# Patient Record
Sex: Male | Born: 1980 | Race: Black or African American | Hispanic: No | Marital: Married | Smoking: Current some day smoker
Health system: Southern US, Community
[De-identification: ages and names within clinical notes are randomized; demographics above are authoritative.]

## PROBLEM LIST (undated history)

## (undated) DIAGNOSIS — I10 Essential (primary) hypertension: Secondary | ICD-10-CM

---

## 2013-10-05 DIAGNOSIS — F411 Generalized anxiety disorder: Secondary | ICD-10-CM | POA: Insufficient documentation

## 2013-10-05 DIAGNOSIS — F4310 Post-traumatic stress disorder, unspecified: Secondary | ICD-10-CM | POA: Diagnosis present

## 2014-06-27 ENCOUNTER — Emergency Department (HOSPITAL_BASED_OUTPATIENT_CLINIC_OR_DEPARTMENT_OTHER)
Admission: EM | Admit: 2014-06-27 | Discharge: 2014-06-27 | Disposition: A | Discharge status: Home / Self Care | Payer: 59 | Attending: Emergency Medicine | Admitting: Emergency Medicine

## 2014-06-27 ENCOUNTER — Encounter (HOSPITAL_BASED_OUTPATIENT_CLINIC_OR_DEPARTMENT_OTHER): Payer: Self-pay

## 2014-06-27 ENCOUNTER — Emergency Department (HOSPITAL_BASED_OUTPATIENT_CLINIC_OR_DEPARTMENT_OTHER): Payer: 59

## 2014-06-27 DIAGNOSIS — Z720 Tobacco use: Secondary | ICD-10-CM | POA: Diagnosis not present

## 2014-06-27 DIAGNOSIS — X58XXXA Exposure to other specified factors, initial encounter: Secondary | ICD-10-CM | POA: Insufficient documentation

## 2014-06-27 DIAGNOSIS — Y9289 Other specified places as the place of occurrence of the external cause: Secondary | ICD-10-CM | POA: Insufficient documentation

## 2014-06-27 DIAGNOSIS — Y998 Other external cause status: Secondary | ICD-10-CM | POA: Insufficient documentation

## 2014-06-27 DIAGNOSIS — S6992XA Unspecified injury of left wrist, hand and finger(s), initial encounter: Secondary | ICD-10-CM | POA: Diagnosis present

## 2014-06-27 DIAGNOSIS — M79643 Pain in unspecified hand: Secondary | ICD-10-CM

## 2014-06-27 DIAGNOSIS — Y9389 Activity, other specified: Secondary | ICD-10-CM | POA: Diagnosis not present

## 2014-06-27 NOTE — ED Provider Notes (Signed)
CSN: [id number]     Arrival date & time 06/27/14  1209 History   First MD Initiated Contact with Patient 06/27/14 1219     Chief Complaint  Patient presents with  . Hand Injury     (Consider location/radiation/quality/duration/timing/severity/associated sxs/prior Treatment) HPI Comments: Pt comes in with c/o bilateral hand pain. Pt states that on the left middle finger he injured it a couple of weeks ago and now he has pain every time it touches anything. He states that he is having pain over the right middle knuckle. Denies previous injury. No  Numbness or weakness. Hasn't tried anything for the symptoms  The history is provided by the patient. No language interpreter was used.    History reviewed. No pertinent past medical history. History reviewed. No pertinent past surgical history. No family history on file. History  Substance Use Topics  . Smoking status: Current Some Day Smoker  . Smokeless tobacco: Not on file  . Alcohol Use: No    Review of Systems  All other systems reviewed and are negative.     Allergies  Shellfish allergy  Home Medications   Prior to Admission medications   Not on File   BP 129/75 mmHg  Pulse 76  Temp(Src) 98.1 F (36.7 C) (Oral)  Resp 16  Ht 5\' 9"  (1.753 m)  Wt 175 lb (79.379 kg)  BMI 25.83 kg/m2  SpO2 97% Physical Exam  Constitutional: He is oriented to person, place, and time. He appears well-developed and well-nourished.  Cardiovascular: Normal rate and regular rhythm.   Pulmonary/Chest: Effort normal and breath sounds normal.  Musculoskeletal: Normal range of motion.  No gross deformity, swelling redness or warmth noted to either hand  Neurological: He is alert and oriented to person, place, and time.  Skin: Skin is warm and dry.  Nursing note and vitals reviewed.   ED Course  Procedures (including critical care time) Labs Review Labs Reviewed - No data to display  Imaging Review Dg Hand Complete Right  06/27/2014    CLINICAL DATA:  Right hand injury moving 2 weeks ago. Initial encounter.  EXAM: RIGHT HAND - COMPLETE 3+ VIEW  COMPARISON:  None.  FINDINGS: There is no evidence of fracture or dislocation. There is no evidence of arthropathy or other focal bone abnormality. Soft tissues are unremarkable.  IMPRESSION: Negative.   Electronically Signed   By: Marnee SpringJonathon  Watts M.D.   On: 06/27/2014 13:14   Dg Finger Middle Left  06/27/2014   CLINICAL DATA:  Pain following injury 6 weeks prior  EXAM: LEFT THIRD FINGER 2+V  COMPARISON:  None.  FINDINGS: Frontal, oblique, and lateral views were obtained. There is no fracture or dislocation. Joint spaces appear intact. No erosive change.  IMPRESSION: No fracture or dislocation.  No appreciable arthropathic change.   Electronically Signed   By: Bretta BangWilliam  Woodruff III M.D.   On: 06/27/2014 13:14     EKG Interpretation None      MDM   Final diagnoses:  Pain of hand, unspecified laterality    No acute bony abnormality. Pt given follow up as needed. Can take ibuprofen for pain    Teressa LowerVrinda Henok Heacock, NP 06/27/14 1322  Blane OharaJoshua Zavitz, MD 06/29/14 [phone]

## 2014-06-27 NOTE — Discharge Instructions (Signed)

## 2014-06-27 NOTE — ED Notes (Signed)
Injured left hand 1.5 months ago at work-right hand injury moving 2 weeks ago

## 2017-07-29 ENCOUNTER — Ambulatory Visit
Admission: RE | Admit: 2017-07-29 | Discharge: 2017-07-29 | Disposition: A | Discharge status: Home / Self Care | Payer: 59 | Source: Ambulatory Visit | Attending: Family Medicine | Admitting: Family Medicine

## 2017-07-29 ENCOUNTER — Other Ambulatory Visit: Payer: Self-pay | Admitting: Family Medicine

## 2017-07-29 DIAGNOSIS — R1032 Left lower quadrant pain: Secondary | ICD-10-CM

## 2017-07-29 MED ORDER — IOPAMIDOL (ISOVUE-300) INJECTION 61%
100.0000 mL | Freq: Once | INTRAVENOUS | Status: AC | PRN
  Administered 2017-07-29: 100 mL via INTRAVENOUS

## 2018-05-27 ENCOUNTER — Ambulatory Visit (HOSPITAL_COMMUNITY)
Admission: RE | Admit: 2018-05-27 | Discharge: 2018-05-27 | Disposition: A | Discharge status: Home / Self Care | Payer: BLUE CROSS/BLUE SHIELD | Attending: Psychiatry | Admitting: Psychiatry

## 2018-05-27 DIAGNOSIS — R454 Irritability and anger: Secondary | ICD-10-CM | POA: Diagnosis not present

## 2018-05-27 DIAGNOSIS — F419 Anxiety disorder, unspecified: Secondary | ICD-10-CM | POA: Diagnosis not present

## 2018-05-27 DIAGNOSIS — G4700 Insomnia, unspecified: Secondary | ICD-10-CM | POA: Insufficient documentation

## 2018-05-27 DIAGNOSIS — R45851 Suicidal ideations: Secondary | ICD-10-CM | POA: Diagnosis not present

## 2018-05-27 DIAGNOSIS — F329 Major depressive disorder, single episode, unspecified: Secondary | ICD-10-CM | POA: Diagnosis not present
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
## 2019-02-15 IMAGING — CT CT ABD-PELV W/ CM
1 of 2 series · 13 of 32 positions shown, 18 images · IV contrast (iopamidol)
Comparison: None available.

CLINICAL DATA: Initial evaluation for acute left lower quadrant
pain, burning, cramping for 2 weeks.

EXAM:
CT ABDOMEN AND PELVIS WITH CONTRAST
TECHNIQUE: Multidetector CT imaging of the abdomen and pelvis was performed
using the standard protocol following bolus administration of
intravenous contrast.
CONTRAST:  100mL INEUL6-HDD IOPAMIDOL (INEUL6-HDD) INJECTION 61%

[Series 2: abd/pelvis w/cm · axial · 0.75mm/px · z∈[-610,-145]mm · 13 of 105 slices shown, 18 images]
[im 6/105  soft-tissue]
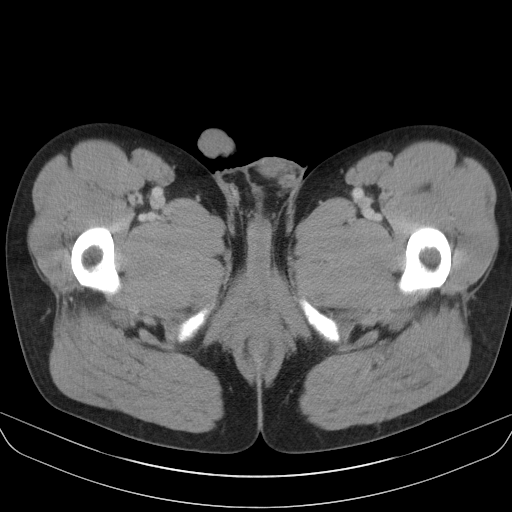
[im 6/105  bone]
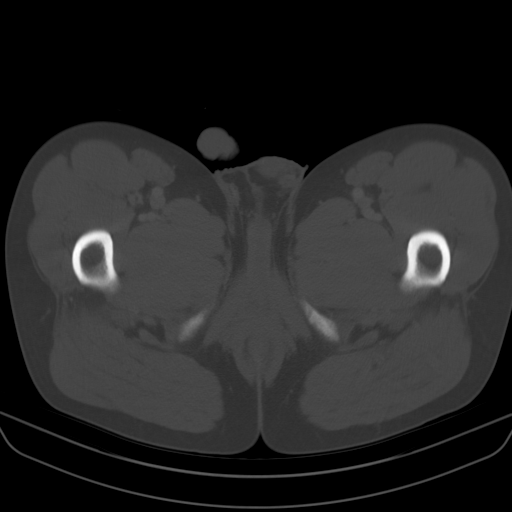
[im 17/105  soft-tissue]
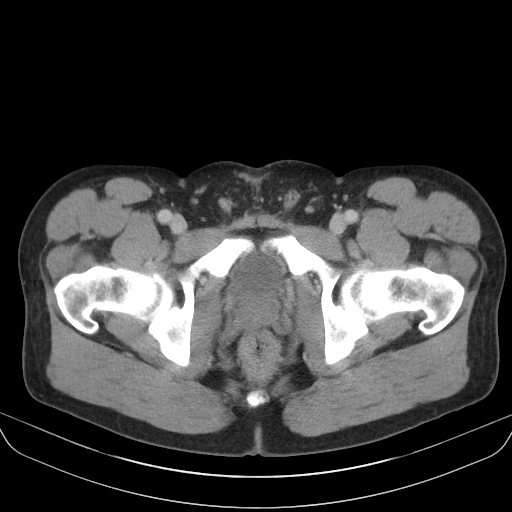
[im 22/105  soft-tissue]
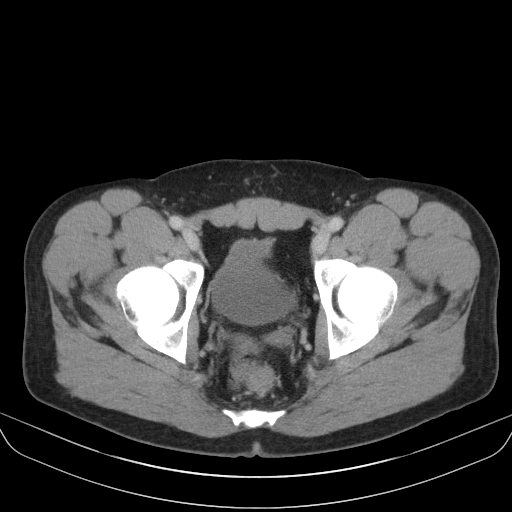
[im 33/105  soft-tissue]
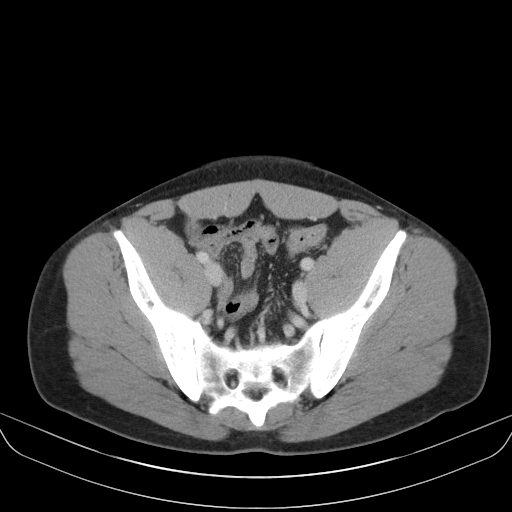
[im 39/105  soft-tissue]
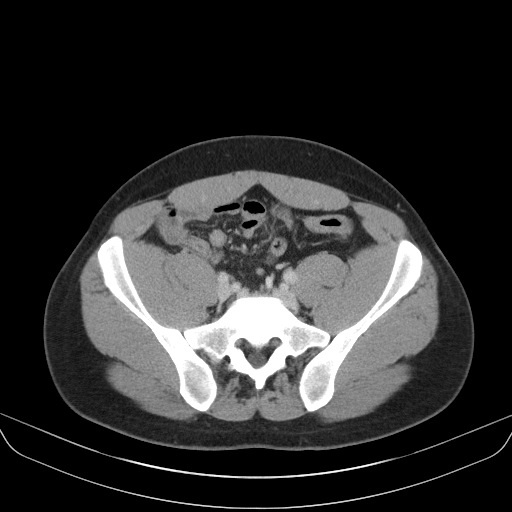
[im 50/105  soft-tissue]
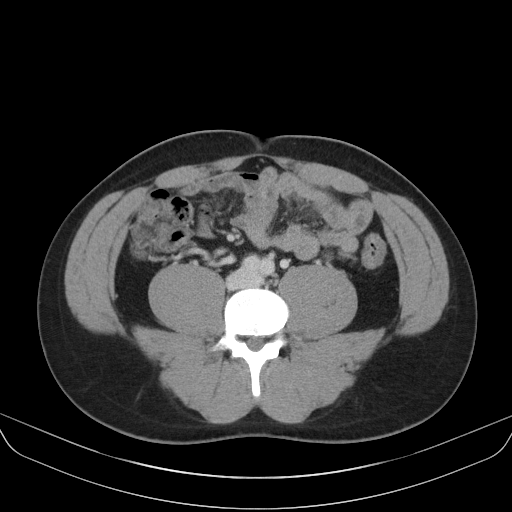
[im 55/105  soft-tissue]
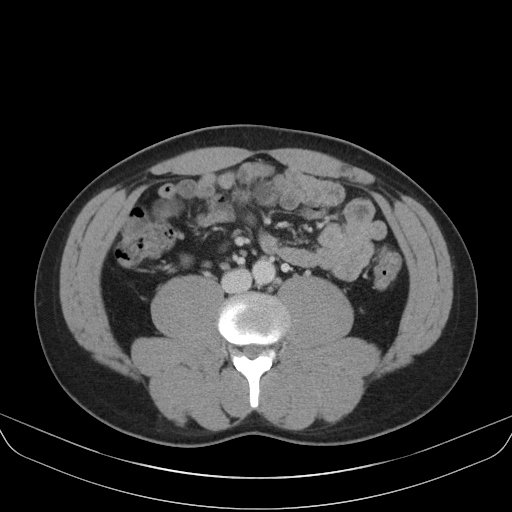
[im 66/105  soft-tissue]
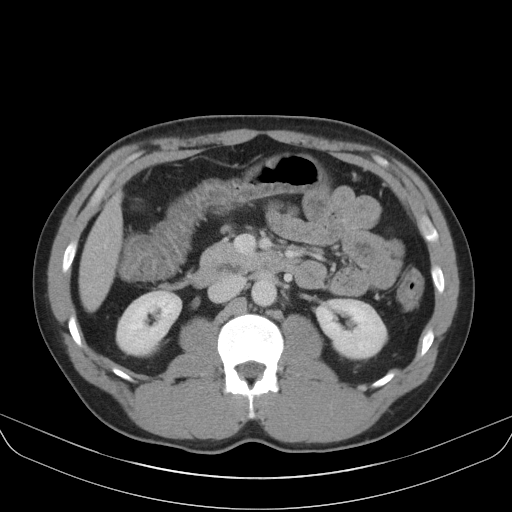
[im 72/105  soft-tissue]
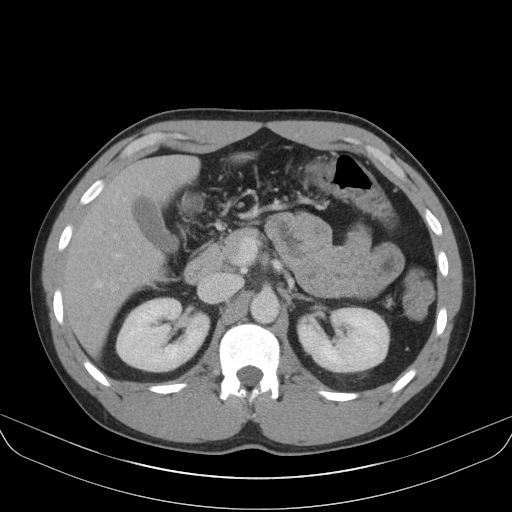
[im 72/105  bone]
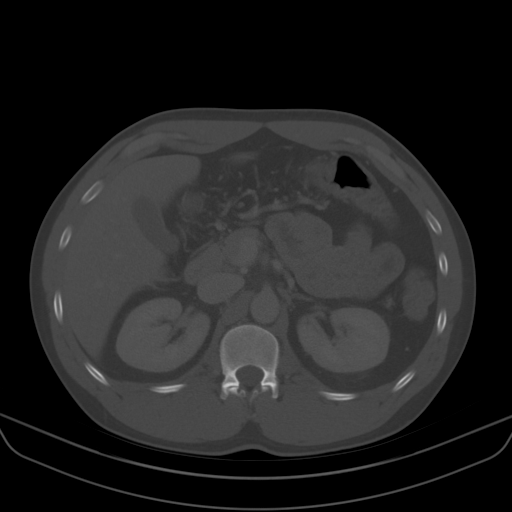
[im 83/105  soft-tissue]
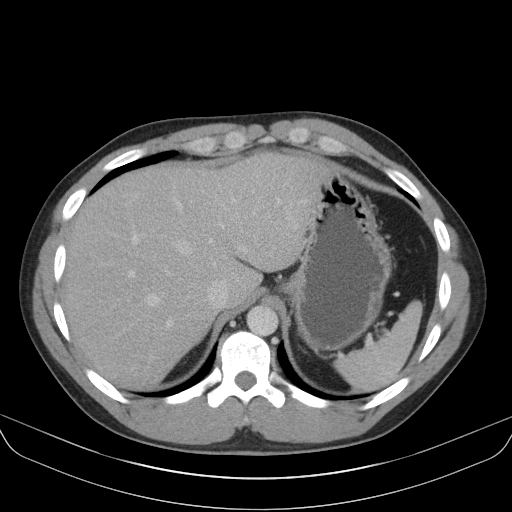
[im 83/105  lung]
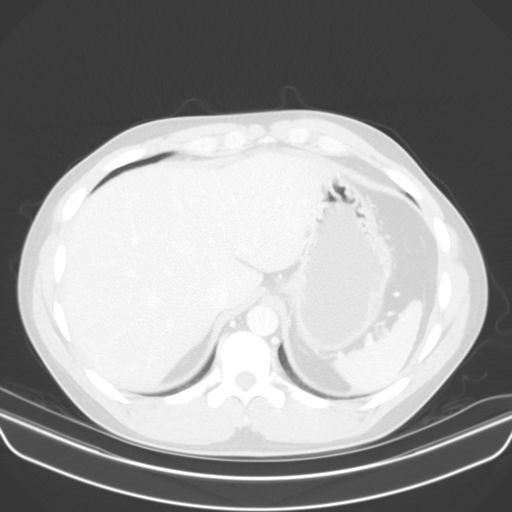
[im 88/105  soft-tissue]
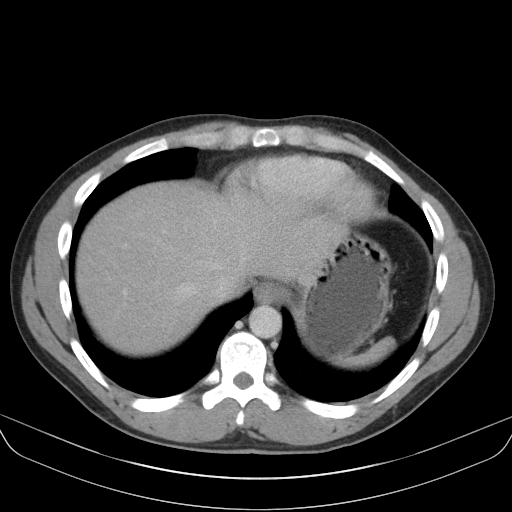
[im 88/105  lung]
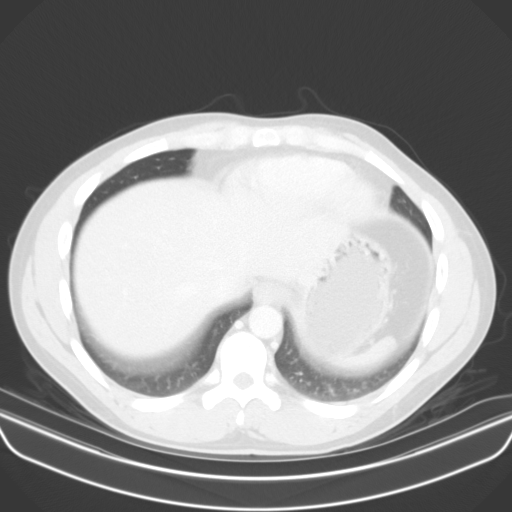
[im 94/105  lung]
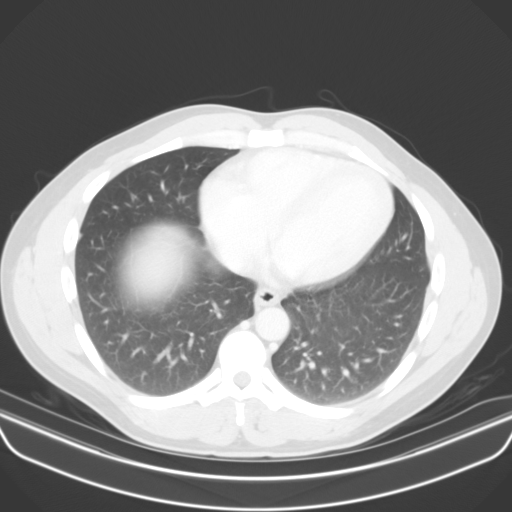
[im 99/105  soft-tissue]
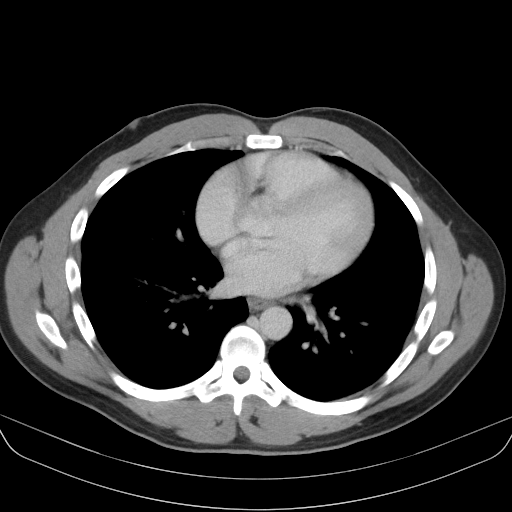
[im 99/105  lung]
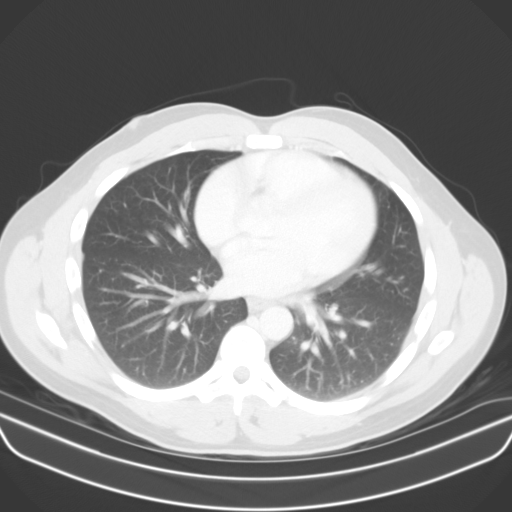

[13 of 32 positions shown; findings below may reference images not displayed]

FINDINGS: Lower chest: Visualized lung bases are clear.

Hepatobiliary: Few scattered subcentimeter hypodensities noted
within the liver, largest of which position within the right hepatic
lobe in measures 7 mm, too small the characterize, but may reflect
small cyst. These are of doubtful significance. Liver otherwise
demonstrates a normal contrast enhanced appearance. Gallbladder
within normal limits. No biliary dilatation.

Pancreas: Pancreas within normal limits.

Spleen: Spleen within normal limits.

Adrenals/Urinary Tract: Adrenal glands are normal. Kidneys equal in
size with symmetric enhancement. No nephrolithiasis, hydronephrosis,
or focal enhancing renal mass. No hydroureter. Partially distended
bladder within normal limits.

Stomach/Bowel: Stomach within normal limits. No evidence for bowel
obstruction. Appendix within normal limits

Colon is diffusely decompressed. Mild circumferential wall
thickening seen throughout the transverse, descending, and sigmoid
colon. While this finding may in part be related incomplete
distension, possible mild acute colitis could also be considered in
the correct clinical setting. No other acute inflammatory changes
seen about the bowels.

Vascular/Lymphatic: Normal intravascular enhancement seen throughout
the intra-abdominal aorta. No aneurysm. Mesenteric vessels are
patent proximally. No pathologically enlarged intra-abdominal or
pelvic lymph nodes.

Reproductive: Prostate normal.

Other: No free air or fluid.

Musculoskeletal: No acute osseus abnormality. No worrisome lytic or
blastic osseous lesions.
IMPRESSION: 1. Diffuse circumferential wall thickening about the transverse,
descending, and sigmoid colon. While this finding may in part be
related incomplete distension, possible acute colitis could also
have this appearance in the correct clinical setting.
2. No other acute intra-abdominal or pelvic process.

## 2019-07-12 ENCOUNTER — Telehealth: Payer: Self-pay | Admitting: General Practice

## 2019-07-12 NOTE — Telephone Encounter (Signed)
Called Pt and was unable to LVM: Received online request to schedule as new patient.

## 2020-05-09 ENCOUNTER — Ambulatory Visit: Payer: BC Managed Care – PPO | Attending: Internal Medicine

## 2020-05-09 DIAGNOSIS — Z23 Encounter for immunization: Secondary | ICD-10-CM

## 2020-05-09 NOTE — Progress Notes (Signed)
   Covid-19 Vaccination Clinic  Name:  Justin Benton    MRN: [id number] DOB: 14-Mar-1981  05/09/2020  [name] was observed post Covid-19 immunization for 15 minutes without incident. He was provided with Vaccine Information Sheet and instruction to access the V-Safe system.   [name] was instructed to call 911 with any severe reactions post vaccine: Marland Kitchen Difficulty breathing  . Swelling of face and throat  . A fast heartbeat  . A bad rash all over body  . Dizziness and weakness   Immunizations Administered    Name Date Dose VIS Date Route   Moderna Covid-19 Booster Vaccine 05/09/2020  1:21 PM 0.25 mL 01/26/2020 Intramuscular   Manufacturer: Gala Murdoch   Lot: [id number]   NDC: 49449-675-91

## 2022-08-22 ENCOUNTER — Emergency Department (HOSPITAL_COMMUNITY): Payer: BC Managed Care – PPO

## 2022-08-22 ENCOUNTER — Other Ambulatory Visit: Payer: Self-pay

## 2022-08-22 ENCOUNTER — Observation Stay (HOSPITAL_BASED_OUTPATIENT_CLINIC_OR_DEPARTMENT_OTHER): Payer: BC Managed Care – PPO

## 2022-08-22 ENCOUNTER — Observation Stay (HOSPITAL_COMMUNITY)
Admission: EM | Admit: 2022-08-22 | Discharge: 2022-08-23 | Disposition: A | Discharge status: Home / Self Care | Payer: BC Managed Care – PPO | Attending: Family Medicine | Admitting: Family Medicine

## 2022-08-22 ENCOUNTER — Encounter (HOSPITAL_COMMUNITY): Payer: Self-pay

## 2022-08-22 DIAGNOSIS — F418 Other specified anxiety disorders: Secondary | ICD-10-CM | POA: Diagnosis present

## 2022-08-22 DIAGNOSIS — F17210 Nicotine dependence, cigarettes, uncomplicated: Secondary | ICD-10-CM | POA: Insufficient documentation

## 2022-08-22 DIAGNOSIS — R079 Chest pain, unspecified: Secondary | ICD-10-CM

## 2022-08-22 DIAGNOSIS — I10 Essential (primary) hypertension: Secondary | ICD-10-CM | POA: Diagnosis not present

## 2022-08-22 DIAGNOSIS — R197 Diarrhea, unspecified: Secondary | ICD-10-CM | POA: Insufficient documentation

## 2022-08-22 DIAGNOSIS — Z79899 Other long term (current) drug therapy: Secondary | ICD-10-CM | POA: Diagnosis not present

## 2022-08-22 DIAGNOSIS — E785 Hyperlipidemia, unspecified: Secondary | ICD-10-CM | POA: Diagnosis present

## 2022-08-22 DIAGNOSIS — R0789 Other chest pain: Secondary | ICD-10-CM | POA: Diagnosis present

## 2022-08-22 DIAGNOSIS — L639 Alopecia areata, unspecified: Secondary | ICD-10-CM | POA: Insufficient documentation

## 2022-08-22 DIAGNOSIS — R002 Palpitations: Secondary | ICD-10-CM | POA: Diagnosis not present

## 2022-08-22 DIAGNOSIS — F4310 Post-traumatic stress disorder, unspecified: Secondary | ICD-10-CM | POA: Diagnosis present

## 2022-08-22 DIAGNOSIS — R072 Precordial pain: Secondary | ICD-10-CM

## 2022-08-22 DIAGNOSIS — I160 Hypertensive urgency: Secondary | ICD-10-CM | POA: Diagnosis not present

## 2022-08-22 DIAGNOSIS — I161 Hypertensive emergency: Secondary | ICD-10-CM | POA: Diagnosis present

## 2022-08-22 HISTORY — DX: Essential (primary) hypertension: I10

## 2022-08-22 LAB — ECHOCARDIOGRAM COMPLETE
Area-P 1/2: 2.91 cm2
Calc EF: 58.1 %
Height: 69 in
S' Lateral: 3.5 cm
Single Plane A2C EF: 57 %
Single Plane A4C EF: 59.9 %
Weight: 2960 oz

## 2022-08-22 LAB — BASIC METABOLIC PANEL
Anion gap: 10 (ref 5–15)
BUN: 12 mg/dL (ref 6–20)
CO2: 23 mmol/L (ref 22–32)
Calcium: 8.8 mg/dL — ABNORMAL LOW (ref 8.9–10.3)
Chloride: 101 mmol/L (ref 98–111)
Creatinine, Ser: 0.94 mg/dL (ref 0.61–1.24)
GFR, Estimated: >60 mL/min (ref >60)
Glucose, Bld: 110 mg/dL — ABNORMAL HIGH (ref 70–99)
Potassium: 4 mmol/L (ref 3.5–5.1)
Sodium: 134 mmol/L — ABNORMAL LOW (ref 135–145)

## 2022-08-22 LAB — CBC WITH DIFFERENTIAL/PLATELET
Abs Immature Granulocytes: 0.02 10*3/uL (ref 0.00–0.07)
Basophils Absolute: 0 10*3/uL (ref 0.0–0.1)
Basophils Relative: 1 %
Eosinophils Absolute: 0.2 10*3/uL (ref 0.0–0.5)
Eosinophils Relative: 2 %
HCT: 44.1 % (ref 39.0–52.0)
Hemoglobin: 15.6 g/dL (ref 13.0–17.0)
Immature Granulocytes: 0 %
Lymphocytes Relative: 24 %
Lymphs Abs: 1.9 10*3/uL (ref 0.7–4.0)
MCH: 36.7 pg — ABNORMAL HIGH (ref 26.0–34.0)
MCHC: 35.4 g/dL (ref 30.0–36.0)
MCV: 103.8 fL — ABNORMAL HIGH (ref 80.0–100.0)
Monocytes Absolute: 0.6 10*3/uL (ref 0.1–1.0)
Monocytes Relative: 8 %
Neutro Abs: 5.4 10*3/uL (ref 1.7–7.7)
Neutrophils Relative %: 65 %
Platelets: 215 10*3/uL (ref 150–400)
RBC: 4.25 MIL/uL (ref 4.22–5.81)
RDW: 12.3 % (ref 11.5–15.5)
WBC: 8.2 10*3/uL (ref 4.0–10.5)
nRBC: 0 % (ref 0.0–0.2)

## 2022-08-22 LAB — TROPONIN I (HIGH SENSITIVITY)
Troponin I (High Sensitivity): 3 ng/L (ref <18)
Troponin I (High Sensitivity): 4 ng/L (ref <18)

## 2022-08-22 LAB — D-DIMER, QUANTITATIVE: D-Dimer, Quant: 0.3 ug/mL-FEU (ref 0.00–0.50)

## 2022-08-22 LAB — BRAIN NATRIURETIC PEPTIDE: B Natriuretic Peptide: 8.6 pg/mL (ref 0.0–100.0)

## 2022-08-22 MED ORDER — ENOXAPARIN SODIUM 40 MG/0.4ML IJ SOSY
40.0000 mg | PREFILLED_SYRINGE | INTRAMUSCULAR | Status: DC
  Administered 2022-08-22: 40 mg via SUBCUTANEOUS
  Filled 2022-08-22 (×2): qty 0.4

## 2022-08-22 MED ORDER — METOPROLOL TARTRATE 50 MG PO TABS: 50.0000 mg | ORAL_TABLET | Freq: Once | ORAL | 0 refills | Status: DC

## 2022-08-22 MED ORDER — PERMETHRIN 5 % EX CREA
TOPICAL_CREAM | CUTANEOUS | Status: DC
  Filled 2022-08-22: qty 60

## 2022-08-22 MED ORDER — SODIUM CHLORIDE (PF) 0.9 % IJ SOLN
INTRAMUSCULAR | Status: AC
  Filled 2022-08-22: qty 50

## 2022-08-22 MED ORDER — ACETAMINOPHEN 325 MG PO TABS
650.0000 mg | ORAL_TABLET | Freq: Four times a day (QID) | ORAL | Status: DC | PRN
  Administered 2022-08-22: 650 mg via ORAL
  Filled 2022-08-22: qty 2

## 2022-08-22 MED ORDER — ASPIRIN 81 MG PO CHEW
324.0000 mg | CHEWABLE_TABLET | Freq: Once | ORAL | Status: AC
  Administered 2022-08-22: 324 mg via ORAL
  Filled 2022-08-22: qty 4

## 2022-08-22 MED ORDER — ONDANSETRON HCL 4 MG/2ML IJ SOLN: 4.0000 mg | Freq: Four times a day (QID) | INTRAMUSCULAR | Status: DC | PRN

## 2022-08-22 MED ORDER — ENOXAPARIN SODIUM 40 MG/0.4ML IJ SOSY
40.0000 mg | PREFILLED_SYRINGE | INTRAMUSCULAR | Status: DC
Start: 2022-08-22 — End: 2022-08-22

## 2022-08-22 MED ORDER — ASPIRIN 81 MG PO TBEC
81.0000 mg | DELAYED_RELEASE_TABLET | Freq: Every day | ORAL | Status: DC
  Administered 2022-08-23: 81 mg via ORAL
  Filled 2022-08-22: qty 1

## 2022-08-22 MED ORDER — AMLODIPINE BESYLATE 10 MG PO TABS
5.0000 mg | ORAL_TABLET | Freq: Every day | ORAL | Status: DC
  Administered 2022-08-22 – 2022-08-23 (×2): 5 mg via ORAL
  Filled 2022-08-22 (×2): qty 1

## 2022-08-22 MED ORDER — IOHEXOL 350 MG/ML SOLN
100.0000 mL | Freq: Once | INTRAVENOUS | Status: AC | PRN
  Administered 2022-08-22: 100 mL via INTRAVENOUS

## 2022-08-22 MED ORDER — PERMETHRIN 1 % EX LIQD
Freq: Once | CUTANEOUS | Status: AC
  Filled 2022-08-22: qty 59

## 2022-08-22 NOTE — Consult Note (Addendum)
Cardiology Consultation   Patient ID: Justin Benton MRN: [id number]; DOB: 1980/10/02  Admit date: 08/22/2022 Date of Consult: 08/22/2022  PCP:  [facility]   [facility] Providers Cardiologist:  New (Dr. Royann Shivers)  Patient Profile:   Justin Benton is a 42 y.o. male with a history of hypertension, hyperlipidemia, anxiety/ depression, and tobacco abuse who is being seen 08/22/2022 for the evaluation of chest pain at the request of Dr. Robb Matar.  History of Present Illness:   Justin Benton is a 42 year old male with a history of hypertension, hyperlipidemia, anxiety/ depression, and tobacco abuse but no known cardiac history.  Patient presented to the ED earlier this morning for further evaluation of chest pain. 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 He was in his usual state of health last night at work doing usual activities when he developed sudden onset of sharp pain in his right knee that radiated down to his calf. He states his leg was tight and he had trouble walking for about 35-40 minutes. Knee pain then resolved and he develop lower substernal non-radiating chest pain that he described as a squeezing sensation that last for about 50-20 minutes. Chest pain then resolved on its own and the knee pain came back. He had some associated mild shortness  of breath with the chest pain but states it is not unusual for him to have occasional shortness of breath and attributes this to his bad posture because shortness of breath will resolve when he sits up straight. He also reported some diaphoresis with this episodes but this is also not unusual for him. He reports night sweats and states he has to sleep in a very cold room. He reports some shortness of breath at night which sound like it is from sleep apnea rather than orthopnea or PND. He denies any exertion chest pain or dyspnea. No edema. He has some intermittent palpitations that he describes  as a fluttering sensation as well as more prolonged episodes when he is having anxiety/ panic attack. He reports intermittent lightheadedness/ dizziness that tend to be with position changes but none of this recently. No syncope. No recent fevers or illnesses. No cough or nasal congestion. He will frequently wake up "dry heaving" but no new GI issues. No abnormal bleeding in urine or stools. His biggest concern right now seems to actually be his depression. He states he has been struggling with significant depression for the last 1 to 1.5 years. He used to be on medication and was doing well but stopped taking this about 1 to 1.5 years and then stopped taking his BP and cholesterol medication and stopped exercising and eating well. He denies any current suicidal ideation but has had this in the past. He also mentioned  "anxiety attacks" multiple times and has had some chest tightness and heart racing during these episodes.  Upon arrival to the ED, patient hypertensive but vitals stable. EKG showed normal sinus rhythm, rate 88 bpm, with PVC as well as T wave inversions in leads III and aVF and then non-specific T wave changes in leads V3-V6. High-sensitivity troponin negative x2 (3 >> 4). BNP normal. D-dimer negative. CTA was negative for aortic dissection and also specifically mentioned no atherosclerotic calcification were noted in the coronary arteries. WBC 8.2, Hgb 15.6, Plts 215. Na 134, K 4.0, Glucose 110, BUN 12, Cr 0.94. Patient was admitted to Internal Medicine service and Cardiology was consulted  for further evaluation. Echo was ordered and showed LVEF of 55-60% with no regional wall motion abnormalities and no significant valvular disease.   At the time of this evaluation, patient is currently asymptomatic. No more chest pain since the above event.    He has a 15 year smoking history and currently smokes 1 pack per day. He also reports drinking 2-3 glasses of wine on the days that he works (he works  15 days out of the day). He denies any recreational drug use. He denies any known family history of heart disease including MI and CHF.  Past Medical History:  Diagnosis Date   Hypertension     History reviewed. No pertinent surgical history.   Home Medications:  Prior to Admission medications   Not on File    Inpatient Medications: Scheduled Meds:  enoxaparin (LOVENOX) injection  40 mg Subcutaneous Q24H   Continuous Infusions:  PRN Meds: acetaminophen, ondansetron (ZOFRAN) IV  Allergies:    Allergies  Allergen Reactions   Shellfish Allergy Shortness Of Breath and Swelling    Social History:   Social History   Socioeconomic History   Marital status: Married    Spouse name: Not on file   Number of children: Not on file   Years of education: Not on file   Highest education level: Not on file  Occupational History   Not on file  Tobacco Use   Smoking status: Some Days   Smokeless tobacco: Not on file  Substance and Sexual Activity   Alcohol use: No   Drug use: No   Sexual activity: Not on file  Other Topics Concern   Not on file  Social History Narrative   Not on file   Social Determinants of Health   Financial Resource Strain: Not on file  Food Insecurity: No Food Insecurity (08/22/2022)   Hunger Vital Sign    Worried About Running Out of Food in the Last Year: Never true    Ran Out of Food in the Last Year: Never true  Transportation Needs: No Transportation Needs (08/22/2022)   PRAPARE - Administrator, Civil Service (Medical): No    Lack of Transportation (Non-Medical): No  Physical Activity: Not on file  Stress: Not on file  Social Connections: Not on file  Intimate Partner Violence: Not At Risk (08/22/2022)   Humiliation, Afraid, Rape, and Kick questionnaire    Fear of Current or Ex-Partner: No    Emotionally Abused: No    Physically Abused: No    Sexually Abused: No    Family History:   History reviewed. No pertinent family  history.   ROS:  Please see the history of present illness.  Review of Systems  Constitutional:  Positive for diaphoresis. Negative for fever.  HENT:  Negative for congestion.   Respiratory:  Positive for shortness of breath. Negative for cough.   Cardiovascular:  Positive for chest pain and palpitations. Negative for leg swelling.  Gastrointestinal:  Positive for heartburn. Negative for blood in stool and melena. Abdominal pain: dry heaving. Genitourinary:  Negative for hematuria.  Musculoskeletal:  Positive for joint pain (right knee pain).  Neurological:  Negative for loss of consciousness.  Endo/Heme/Allergies:  Does not bruise/bleed easily.  Psychiatric/Behavioral:  Positive for depression and substance abuse. The patient is nervous/anxious.     All other ROS reviewed and negative.     Physical Exam/Data:   Vitals:   08/22/22 0700 08/22/22 0713 08/22/22 0853 08/22/22 0925  BP: (!) 147/97  Marland Kitchen)  141/102 (!) 164/107  Pulse: 69 64 62 62  Resp: 15 14  18   Temp:  98.2 F (36.8 C)  98.2 F (36.8 C)  TempSrc:  Oral  Oral  SpO2: 99% 98% 99% 100%  Weight:      Height:       No intake or output data in the 24 hours ending 08/22/22 1129    08/22/2022    3:12 AM 06/27/2014   12:21 PM  Last 3 Weights  Weight (lbs) 185 lb 175 lb  Weight (kg) 83.915 kg 79.379 kg     Body mass index is 27.32 kg/m.  General: 42 y.o. African-American male resting comfortably in no acute distress. HEENT: Normocephalic and atraumatic. Sclera clear.  Neck: Supple. No carotid bruits. No JVD. Heart: RRR. Distinct S1 and S2. No murmurs, gallops, or rubs. Radial and distal pedal pulses 2+ and equal bilaterally. Lungs: No increased work of breathing. Clear to ausculation bilaterally. No wheezes, rhonchi, or rales.  Abdomen: Soft, non-distended, and non-tender to palpation.   Extremities: No lower extremity edema.  Skin: Warm and dry. Neuro: Alert and oriented x3. No focal deficits. Psych: Normal affect.  Responds appropriately.   EKG:  The EKG was personally reviewed and demonstrates:  Normal sinus rhythm, rate 88 bpm, with PVC as well as T wave inversions in leads III and aVF and then non-specific T wave changes in leads V3-V6.  Telemetry:  Telemetry was personally reviewed and demonstrates:  Normal sinus rhythm with rates in the 60s to 70s.  Relevant CV Studies:  Echocardiogram 08/22/2022: Impressions: 1. Left ventricular ejection fraction, by estimation, is 55 to 60%. The  left ventricle has normal function. The left ventricle has no regional  wall motion abnormalities. Left ventricular diastolic parameters were  normal.   2. Right ventricular systolic function is normal. The right ventricular  size is normal. Tricuspid regurgitation signal is inadequate for assessing  PA pressure.   3. The mitral valve is normal in structure. No evidence of mitral valve  regurgitation. No evidence of mitral stenosis.   4. The aortic valve is normal in structure. Aortic valve regurgitation is  not visualized. No aortic stenosis is present.   5. The inferior vena cava is dilated in size with >50% respiratory  variability, suggesting right atrial pressure of 8 mmHg.    Laboratory Data:  High Sensitivity Troponin:   Recent Labs  Lab 08/22/22 0341 08/22/22 0513  TROPONINIHS 3 4     Chemistry Recent Labs  Lab 08/22/22 0341  NA 134*  K 4.0  CL 101  CO2 23  GLUCOSE 110*  BUN 12  CREATININE 0.94  CALCIUM 8.8*  GFRNONAA >60  ANIONGAP 10    No results for input(s): "PROT", "ALBUMIN", "AST", "ALT", "ALKPHOS", "BILITOT" in the last 168 hours. Lipids No results for input(s): "CHOL", "TRIG", "HDL", "LABVLDL", "LDLCALC", "CHOLHDL" in the last 168 hours.  Hematology Recent Labs  Lab 08/22/22 0341  WBC 8.2  RBC 4.25  HGB 15.6  HCT 44.1  MCV 103.8*  MCH 36.7*  MCHC 35.4  RDW 12.3  PLT 215   Thyroid No results for input(s): "TSH", "FREET4" in the last 168 hours.  BNP Recent Labs   Lab 08/22/22 0341  BNP 8.6    DDimer  Recent Labs  Lab 08/22/22 0341  DDIMER 0.30     Radiology/Studies:  ECHOCARDIOGRAM COMPLETE  Result Date: 08/22/2022    ECHOCARDIOGRAM REPORT   Patient Name:   Justin Benton Date of Exam:  08/22/2022 Medical Rec #:  [id number]      Height:       69.0 in Accession #:    [id number]     Weight:       185.0 lb Date of Birth:  November 19, 1980       BSA:          1.999 m Patient Age:    42 years       BP:           164/107 mmHg Patient Gender: M              HR:           57 bpm. Exam Location:  Inpatient Procedure: 2D Echo, Cardiac Doppler and Color Doppler Indications:    R07.9* Chest pain, unspecified  History:        Patient has no prior history of Echocardiogram examinations.                 Risk Factors:Hypertension.  Sonographer:    Mike Gip Referring Phys: [id number] DAVID MANUEL ORTIZ IMPRESSIONS  1. Left ventricular ejection fraction, by estimation, is 55 to 60%. The left ventricle has normal function. The left ventricle has no regional wall motion abnormalities. Left ventricular diastolic parameters were normal.  2. Right ventricular systolic function is normal. The right ventricular size is normal. Tricuspid regurgitation signal is inadequate for assessing PA pressure.  3. The mitral valve is normal in structure. No evidence of mitral valve regurgitation. No evidence of mitral stenosis.  4. The aortic valve is normal in structure. Aortic valve regurgitation is not visualized. No aortic stenosis is present.  5. The inferior vena cava is dilated in size with >50% respiratory variability, suggesting right atrial pressure of 8 mmHg. FINDINGS  Left Ventricle: Left ventricular ejection fraction, by estimation, is 55 to 60%. The left ventricle has normal function. The left ventricle has no regional wall motion abnormalities. The left ventricular internal cavity size was normal in size. There is  no left ventricular hypertrophy. Left ventricular diastolic parameters  were normal. Normal left ventricular filling pressure. Right Ventricle: The right ventricular size is normal. No increase in right ventricular wall thickness. Right ventricular systolic function is normal. Tricuspid regurgitation signal is inadequate for assessing PA pressure. Left Atrium: Left atrial size was normal in size. Right Atrium: Right atrial size was normal in size. Pericardium: There is no evidence of pericardial effusion. Mitral Valve: The mitral valve is normal in structure. No evidence of mitral valve regurgitation. No evidence of mitral valve stenosis. Tricuspid Valve: The tricuspid valve is normal in structure. Tricuspid valve regurgitation is trivial. No evidence of tricuspid stenosis. Aortic Valve: The aortic valve is normal in structure. Aortic valve regurgitation is not visualized. No aortic stenosis is present. Pulmonic Valve: The pulmonic valve was normal in structure. Pulmonic valve regurgitation is trivial. No evidence of pulmonic stenosis. Aorta: The aortic root is normal in size and structure. Venous: The inferior vena cava is dilated in size with greater than 50% respiratory variability, suggesting right atrial pressure of 8 mmHg. IAS/Shunts: No atrial level shunt detected by color flow Doppler.  LEFT VENTRICLE PLAX 2D LVIDd:         5.20 cm      Diastology LVIDs:         3.50 cm      LV e' medial:    10.80 cm/s LV PW:         0.90 cm  LV E/e' medial:  7.0 LV IVS:        1.00 cm      LV e' lateral:   17.20 cm/s LVOT diam:     2.10 cm      LV E/e' lateral: 4.4 LV SV:         74 LV SV Index:   37 LVOT Area:     3.46 cm  LV Volumes (MOD) LV vol d, MOD A2C: 121.0 ml LV vol d, MOD A4C: 124.0 ml LV vol s, MOD A2C: 52.0 ml LV vol s, MOD A4C: 49.7 ml LV SV MOD A2C:     69.0 ml LV SV MOD A4C:     124.0 ml LV SV MOD BP:      71.6 ml RIGHT VENTRICLE             IVC RV Basal diam:  3.80 cm     IVC diam: 2.40 cm RV S prime:     12.20 cm/s TAPSE (M-mode): 1.7 cm LEFT ATRIUM             Index         RIGHT ATRIUM           Index LA diam:        4.00 cm 2.00 cm/m   RA Area:     12.70 cm LA Vol (A2C):   61.5 ml 30.79 ml/m  RA Volume:   23.90 ml  11.96 ml/m LA Vol (A4C):   43.2 ml 21.61 ml/m LA Biplane Vol: 49.9 ml 24.97 ml/m  AORTIC VALVE             PULMONIC VALVE LVOT Vmax:   103.00 cm/s PR End Diast Vel: 4.00 msec LVOT Vmean:  74.100 cm/s LVOT VTI:    0.213 m  AORTA Ao Root diam: 3.20 cm Ao Asc diam:  3.20 cm MITRAL VALVE MV Area (PHT): 2.91 cm    SHUNTS MV Decel Time: 261 msec    Systemic VTI:  0.21 m MV E velocity: 75.40 cm/s  Systemic Diam: 2.10 cm MV A velocity: 51.00 cm/s MV E/A ratio:  1.48 Armanda Magic MD Electronically signed by Armanda Magic MD Signature Date/Time: 08/22/2022/10:53:33 AM    Final    CT Angio Chest/Abd/Pel for Dissection W and/or Wo Contrast  Result Date: 08/22/2022 CLINICAL DATA:  42 year old male presenting with lightheadedness and chest pain. EXAM: CT ANGIOGRAPHY CHEST, ABDOMEN AND PELVIS TECHNIQUE: Non-contrast CT of the chest was initially obtained. Multidetector CT imaging through the chest, abdomen and pelvis was performed using the standard protocol during bolus administration of intravenous contrast. Multiplanar reconstructed images and MIPs were obtained and reviewed to evaluate the vascular anatomy. RADIATION DOSE REDUCTION: This exam was performed according to the departmental dose-optimization program which includes automated exposure control, adjustment of the mA and/or kV according to patient size and/or use of iterative reconstruction technique. CONTRAST:  OMNIPAQUE IOHEXOL 350 MG/ML SOLN COMPARISON:  No prior chest CT. CT of the abdomen and pelvis 07/29/2017. FINDINGS: CTA CHEST FINDINGS Cardiovascular: Precontrast images demonstrate no crescentic high attenuation associated with the wall of the thoracic aorta to suggest the presence of acute intramural hemorrhage. Postcontrast images demonstrate no evidence of thoracic aortic aneurysm or dissection.  Ascending thoracic aorta, mid arch and descending thoracic aorta measure 3.8 cm, 3.0 cm and 2.7 cm in diameter respectively. No atherosclerotic calcifications are noted in the thoracic aorta or the coronary arteries. Heart size is normal. There is no significant pericardial fluid,  thickening or pericardial calcification. Mediastinum/Nodes: No pathologically enlarged mediastinal or hilar lymph nodes. Esophagus is unremarkable in appearance. No axillary lymphadenopathy. Lungs/Pleura: No acute consolidative airspace disease. No pleural effusions. No pneumothorax. No suspicious appearing pulmonary nodules or masses are noted. Musculoskeletal: There are no aggressive appearing lytic or blastic lesions noted in the visualized portions of the skeleton. Review of the MIP images confirms the above findings. CTA ABDOMEN AND PELVIS FINDINGS VASCULAR Aorta: Normal caliber aorta without aneurysm, dissection, vasculitis or significant stenosis. Celiac: Patent without evidence of aneurysm, dissection, vasculitis or significant stenosis. SMA: Patent without evidence of aneurysm, dissection, vasculitis or significant stenosis. Renals: Single right and 3 left renal arteries are noted. All renal arteries are patent without evidence of aneurysm, dissection, vasculitis, fibromuscular dysplasia or significant stenosis. IMA: Patent without evidence of aneurysm, dissection, vasculitis or significant stenosis. Inflow: Patent without evidence of aneurysm, dissection, vasculitis or significant stenosis. Veins: No obvious venous abnormality within the limitations of this arterial phase study. Review of the MIP images confirms the above findings. NON-VASCULAR Hepatobiliary: No focal liver abnormality is seen. No gallstones, gallbladder wall thickening, or biliary dilatation. Pancreas: Unremarkable. No pancreatic ductal dilatation or surrounding inflammatory changes. Spleen: Normal in size without focal abnormality. Adrenals/Urinary Tract:  Adrenal glands are unremarkable. Kidneys are normal, without renal calculi, focal lesion, or hydronephrosis. Bladder is unremarkable. Stomach/Bowel: Stomach is within normal limits. Appendix appears normal. No evidence of bowel wall thickening, distention, or inflammatory changes. Lymphatic: No lymphadenopathy noted in the abdomen or pelvis. Reproductive: Prostate gland and seminal vesicles are unremarkable in appearance. Other: No significant volume of ascites.  No pneumoperitoneum. Musculoskeletal: There are no aggressive appearing lytic or blastic lesions noted in the visualized portions of the skeleton. Review of the MIP images confirms the above findings. IMPRESSION: 1. No acute findings are noted in the chest, abdomen or pelvis. Specifically, no evidence of acute aortic syndrome. Electronically Signed   By: Trudie Reed M.D.   On: 08/22/2022 06:14   DG Chest 2 View  Result Date: 08/22/2022 CLINICAL DATA:  Mid chest pain and right knee pain. No known injury. EXAM: CHEST - 2 VIEW; RIGHT KNEE - COMPLETE 4+ VIEW COMPARISON:  None Available. FINDINGS: PA and lateral chest: The heart size and mediastinal contours are within normal limits. Both lungs are clear. The visualized skeletal structures are unremarkable. Right knee, routine four views: There is a small suprapatellar bursal effusion. Normal bone mineralization. There is no evidence of fracture, dislocation or degenerative changes. Superficial soft tissues are unremarkable. IMPRESSION: 1. No evidence of acute chest disease. 2. Small right knee suprapatellar bursal effusion. No evidence of fractures. Electronically Signed   By: Almira Bar M.D.   On: 08/22/2022 04:15   DG Knee Complete 4 Views Right  Result Date: 08/22/2022 CLINICAL DATA:  Mid chest pain and right knee pain. No known injury. EXAM: CHEST - 2 VIEW; RIGHT KNEE - COMPLETE 4+ VIEW COMPARISON:  None Available. FINDINGS: PA and lateral chest: The heart size and mediastinal contours are  within normal limits. Both lungs are clear. The visualized skeletal structures are unremarkable. Right knee, routine four views: There is a small suprapatellar bursal effusion. Normal bone mineralization. There is no evidence of fracture, dislocation or degenerative changes. Superficial soft tissues are unremarkable. IMPRESSION: 1. No evidence of acute chest disease. 2. Small right knee suprapatellar bursal effusion. No evidence of fractures. Electronically Signed   By: Almira Bar M.D.   On: 08/22/2022 04:15     Assessment and Plan:  Chest Pain Patient presented with atypical chest pain. EKG showed normal sinus rhythm with T wave inversions in leads III and aVF and otherwise only non-specific changes. High-sensitivity troponin negative x2. CTA to rule out aortic dissection was negative and did not show any evidence of coronary calcifications. Echo shows normal LV function with no wall motion abnormalities. He is currently chest pain free. Overall symptoms atypical. He bigger complaint right now seems to be his depression. However, he does have multiple CV risk factors including untreated HTN and HLD and a long smoking history. Therefore, recommend a coronary CTA for further evaluation. Will provide one time dose of Lopressor 50mg  for patient to take 90-120 minutes prior to CTA.  Hypertension History of hypertension but patient states he has not been taking his medications in the last 1 to 1.5 years. . He does not remember what he was taking. BP elevated her. Will defer management to primary team.  Hyperlipidemia Patient reports history of hyperlipidemia but has not been taking his medications in the last 1 to 1.5 years. No lipid panel in our system. He can be discharged from a cardiac standpoint so would just recommend he follow-up with his PCP for this.  Depression Patient reports severe depression over the last 1-2 years. No current suicidal ideation but he states he has had this in the past.  He states the [place] was supposed to be taking over his Psychiatry and therapy but he has not spoken with them yet. Emphasized the importance of reaching out to this. Will defer management of this to primary team.  Disposition: Patient can be discharged from a cardiac standpoint. Will plan for outpatient coronary CTA and then arrange follow-up after that.   Risk Assessment/Risk Scores:    For questions or updates, please contact [facility] Please consult [email or web address] for contact info under    Signed, Corrin Parker, PA-C  08/22/2022 11:29 AM   I have seen and examined the patient along with Signed, Corrin Parker, PA-C .  I have reviewed the chart, notes and new data.  I agree with PA/NP's note.  Key new complaints: He is currently asymptomatic. His chest symptoms are atypical. Seems to escribe anxiety not chest discomfort. Has palpitations with gradual rise and gradual decline in rate suggestive of sinus  tachycardia. Leg pain complaints are decidedly not vascular. Key examination changes: normal exam Key new findings / data: no arrhythmia on telemetry. No abnormalities on biomarkers, ECG or echocardiogram.  PLAN: No need for inpatient cardiology workup. OK to get an OP coronary CTA, but doubt it will show CAD. He has mild HTN.  Consider carvedilol for both BP and palpitations. Do not have a lipid profile.  Plans to f/u w [facility] medical.  Thurmon Fair, MD, [facility] [phone] 08/22/2022, 1:33 PM

## 2022-08-22 NOTE — ED Provider Notes (Signed)
[place] EMERGENCY DEPARTMENT AT [facility] Provider Note   CSN: [id number] Arrival date & time: 08/22/22  0300     History  Chief Complaint  Patient presents with   Chest Pain    Justin Benton is a 42 y.o. male.  Patient comfortable with episode of chest pain which is now resolved.  States he is working at [employer] doing his usual activities which include lifting but nothing out of the ordinary for him.  He suddenly developed right knee pain without any injury or trauma.  Shortly after he had this sharp right knee pain that resolved on its own after about 20 to 30 minutes but then he developed some right-sided central chest pain.  The pain was center of his chest to the right lower ribs and was constant with radiation to his bilateral arms.  Associate with shortness of breath.  Did have some diaphoresis and nausea but no vomiting.  No cough or fever.  Chest pain has now resolved after 20 to 30 minutes with no intervention. Patient denies any cardiac history.  States he was diagnosed with hypertension several years ago but stopped his medications over a year ago due to them making him feel sleepy.  Does not know what he is supposed to be taking. Chest pain is now resolved.  No history of ACS or CAD.  He still having right knee pain but is improved compared to previous.  The history is provided by the patient and the EMS personnel.  Chest Pain Associated symptoms: shortness of breath   Associated symptoms: no abdominal pain, no dizziness, no fever, no headache, no nausea, no vomiting and no weakness        Home Medications Prior to Admission medications   Not on File      Allergies    Shellfish allergy    Review of Systems   Review of Systems  Constitutional:  Negative for activity change, appetite change and fever.  HENT:  Negative for congestion and rhinorrhea.   Respiratory:  Positive for chest tightness and shortness of breath.   Cardiovascular:   Positive for chest pain.  Gastrointestinal:  Negative for abdominal pain, nausea and vomiting.  Genitourinary:  Negative for dysuria and hematuria.  Musculoskeletal:  Positive for arthralgias and myalgias.  Skin:  Negative for rash.  Neurological:  Negative for dizziness, weakness and headaches.   all other systems are negative except as noted in the HPI and PMH.    Physical Exam Updated Vital Signs BP (!) 154/103 (BP Location: Left Arm)   Pulse 94   Temp 98.6 F (37 C) (Oral)   Resp 18   Ht 5\' 9"  (1.753 m)   Wt 83.9 kg   SpO2 100%   BMI 27.32 kg/m  Physical Exam Vitals and nursing note reviewed.  Constitutional:      General: He is not in acute distress.    Appearance: He is well-developed.  HENT:     Head: Normocephalic and atraumatic.     Mouth/Throat:     Pharynx: No oropharyngeal exudate.  Eyes:     Conjunctiva/sclera: Conjunctivae normal.     Pupils: Pupils are equal, round, and reactive to light.  Neck:     Comments: No meningismus. Cardiovascular:     Rate and Rhythm: Normal rate and regular rhythm.     Heart sounds: Normal heart sounds. No murmur heard. Pulmonary:     Effort: Pulmonary effort is normal. No respiratory distress.  Breath sounds: Normal breath sounds.  Abdominal:     Palpations: Abdomen is soft.     Tenderness: There is no abdominal tenderness. There is no guarding or rebound.  Musculoskeletal:        General: No tenderness. Normal range of motion.     Cervical back: Normal range of motion and neck supple.     Comments: R knee normal to inspection.  FROM intact. No effusion. No warmth or erythema. Intact DP and PT pulses Calf soft.   Skin:    General: Skin is warm.  Neurological:     Mental Status: He is alert and oriented to person, place, and time.     Cranial Nerves: No cranial nerve deficit.     Motor: No abnormal muscle tone.     Coordination: Coordination normal.     Comments: No ataxia on finger to nose bilaterally. No  pronator drift. 5/5 strength throughout. CN 2-12 intact.Equal grip strength. Sensation intact.   Psychiatric:        Behavior: Behavior normal.     ED Results / Procedures / Treatments   Labs (all labs ordered are listed, but only abnormal results are displayed) Labs Reviewed  CBC WITH DIFFERENTIAL/PLATELET - Abnormal; Notable for the following components:      Result Value   MCV 103.8 (*)    MCH 36.7 (*)    All other components within normal limits  BASIC METABOLIC PANEL - Abnormal; Notable for the following components:   Sodium 134 (*)    Glucose, Bld 110 (*)    Calcium 8.8 (*)    All other components within normal limits  BRAIN NATRIURETIC PEPTIDE  D-DIMER, QUANTITATIVE  TROPONIN I (HIGH SENSITIVITY)  TROPONIN I (HIGH SENSITIVITY)    EKG EKG Interpretation  Date/Time:  Thursday Aug 22 2022 03:14:23 EDT Ventricular Rate:  96 PR Interval:  155 QRS Duration: 108 QT Interval:  348 QTC Calculation: 440 R Axis:   14 Text Interpretation: Sinus tachycardia Ventricular trigeminy Borderline T abnormalities, inferior leads No significant change was found Confirmed by Glynn Octave [phone]) on 08/22/2022 3:27:01 AM  Radiology CT Angio Chest/Abd/Pel for Dissection W and/or Wo Contrast  Result Date: 08/22/2022 CLINICAL DATA:  42 year old male presenting with lightheadedness and chest pain. EXAM: CT ANGIOGRAPHY CHEST, ABDOMEN AND PELVIS TECHNIQUE: Non-contrast CT of the chest was initially obtained. Multidetector CT imaging through the chest, abdomen and pelvis was performed using the standard protocol during bolus administration of intravenous contrast. Multiplanar reconstructed images and MIPs were obtained and reviewed to evaluate the vascular anatomy. RADIATION DOSE REDUCTION: This exam was performed according to the departmental dose-optimization program which includes automated exposure control, adjustment of the mA and/or kV according to patient size and/or use of iterative  reconstruction technique. CONTRAST:  OMNIPAQUE IOHEXOL 350 MG/ML SOLN COMPARISON:  No prior chest CT. CT of the abdomen and pelvis 07/29/2017. FINDINGS: CTA CHEST FINDINGS Cardiovascular: Precontrast images demonstrate no crescentic high attenuation associated with the wall of the thoracic aorta to suggest the presence of acute intramural hemorrhage. Postcontrast images demonstrate no evidence of thoracic aortic aneurysm or dissection. Ascending thoracic aorta, mid arch and descending thoracic aorta measure 3.8 cm, 3.0 cm and 2.7 cm in diameter respectively. No atherosclerotic calcifications are noted in the thoracic aorta or the coronary arteries. Heart size is normal. There is no significant pericardial fluid, thickening or pericardial calcification. Mediastinum/Nodes: No pathologically enlarged mediastinal or hilar lymph nodes. Esophagus is unremarkable in appearance. No axillary lymphadenopathy. Lungs/Pleura: No acute  consolidative airspace disease. No pleural effusions. No pneumothorax. No suspicious appearing pulmonary nodules or masses are noted. Musculoskeletal: There are no aggressive appearing lytic or blastic lesions noted in the visualized portions of the skeleton. Review of the MIP images confirms the above findings. CTA ABDOMEN AND PELVIS FINDINGS VASCULAR Aorta: Normal caliber aorta without aneurysm, dissection, vasculitis or significant stenosis. Celiac: Patent without evidence of aneurysm, dissection, vasculitis or significant stenosis. SMA: Patent without evidence of aneurysm, dissection, vasculitis or significant stenosis. Renals: Single right and 3 left renal arteries are noted. All renal arteries are patent without evidence of aneurysm, dissection, vasculitis, fibromuscular dysplasia or significant stenosis. IMA: Patent without evidence of aneurysm, dissection, vasculitis or significant stenosis. Inflow: Patent without evidence of aneurysm, dissection, vasculitis or significant stenosis.  Veins: No obvious venous abnormality within the limitations of this arterial phase study. Review of the MIP images confirms the above findings. NON-VASCULAR Hepatobiliary: No focal liver abnormality is seen. No gallstones, gallbladder wall thickening, or biliary dilatation. Pancreas: Unremarkable. No pancreatic ductal dilatation or surrounding inflammatory changes. Spleen: Normal in size without focal abnormality. Adrenals/Urinary Tract: Adrenal glands are unremarkable. Kidneys are normal, without renal calculi, focal lesion, or hydronephrosis. Bladder is unremarkable. Stomach/Bowel: Stomach is within normal limits. Appendix appears normal. No evidence of bowel wall thickening, distention, or inflammatory changes. Lymphatic: No lymphadenopathy noted in the abdomen or pelvis. Reproductive: Prostate gland and seminal vesicles are unremarkable in appearance. Other: No significant volume of ascites.  No pneumoperitoneum. Musculoskeletal: There are no aggressive appearing lytic or blastic lesions noted in the visualized portions of the skeleton. Review of the MIP images confirms the above findings. IMPRESSION: 1. No acute findings are noted in the chest, abdomen or pelvis. Specifically, no evidence of acute aortic syndrome. Electronically Signed   By: Trudie Reed M.D.   On: 08/22/2022 06:14   DG Chest 2 View  Result Date: 08/22/2022 CLINICAL DATA:  Mid chest pain and right knee pain. No known injury. EXAM: CHEST - 2 VIEW; RIGHT KNEE - COMPLETE 4+ VIEW COMPARISON:  None Available. FINDINGS: PA and lateral chest: The heart size and mediastinal contours are within normal limits. Both lungs are clear. The visualized skeletal structures are unremarkable. Right knee, routine four views: There is a small suprapatellar bursal effusion. Normal bone mineralization. There is no evidence of fracture, dislocation or degenerative changes. Superficial soft tissues are unremarkable. IMPRESSION: 1. No evidence of acute chest  disease. 2. Small right knee suprapatellar bursal effusion. No evidence of fractures. Electronically Signed   By: Almira Bar M.D.   On: 08/22/2022 04:15   DG Knee Complete 4 Views Right  Result Date: 08/22/2022 CLINICAL DATA:  Mid chest pain and right knee pain. No known injury. EXAM: CHEST - 2 VIEW; RIGHT KNEE - COMPLETE 4+ VIEW COMPARISON:  None Available. FINDINGS: PA and lateral chest: The heart size and mediastinal contours are within normal limits. Both lungs are clear. The visualized skeletal structures are unremarkable. Right knee, routine four views: There is a small suprapatellar bursal effusion. Normal bone mineralization. There is no evidence of fracture, dislocation or degenerative changes. Superficial soft tissues are unremarkable. IMPRESSION: 1. No evidence of acute chest disease. 2. Small right knee suprapatellar bursal effusion. No evidence of fractures. Electronically Signed   By: Almira Bar M.D.   On: 08/22/2022 04:15    Procedures Procedures    Medications Ordered in ED Medications  aspirin chewable tablet 324 mg (has no administration in time range)    ED Course/ Medical  Decision Making/ A&P                             Medical Decision Making Amount and/or Complexity of Data Reviewed Independent Historian: EMS Labs: ordered. Decision-making details documented in ED Course. Radiology: ordered and independent interpretation performed. Decision-making details documented in ED Course. ECG/medicine tests: ordered and independent interpretation performed. Decision-making details documented in ED Course.  Risk OTC drugs. Prescription drug management. Decision regarding hospitalization.   Episode of chest pain with nausea, shortness of breath, diaphoresis, now resolved.  EKG shows T wave inversions inferiorly with PVCs.  No comparison. Patient did develop right knee pain without injury.  He is still sore in the knee but this is improved compared to  previous.  Chest pain has resolved and not recurred.  Aspirin given.  Troponin is negative.  Labs are reassuring.  Chest x-ray is negative for pneumothorax or infiltrate.  D-dimer is negative with low suspicion for pulmonary embolism.  Unclear source of patient's atraumatic knee pain.  X-rays negative for fracture but does show small effusion.  No evidence of septic joint.  Given patient's EKG changes as well as severe chest pain with diaphoresis and shortness of breath in the setting of exertion, we will plan observation admission given elevated heart score 4.  CTA negative for aortic dissection or pulmonary embolism.  Observation admission discussed with Dr. Robb Matar        Final Clinical Impression(s) / ED Diagnoses Final diagnoses:  None    Rx / DC Orders ED Discharge Orders     None         Drayk Humbarger, Jeannett Senior, MD 08/22/22 [phone]

## 2022-08-22 NOTE — ED Notes (Signed)
ED TO INPATIENT HANDOFF REPORT  ED Nurse Name and Phone #: Donaven Criswell, Paramedic   S Name/Age/Gender Justin Benton 42 y.o. male Room/Bed: WA23/WA23  Code Status   Code Status: Full Code  Home/SNF/Other Patient oriented to: self, place, time, and situation Is this baseline? Yes   Triage Complete: Triage complete  Chief Complaint Chest pain [R07.9]  Triage Note Patient brought in by [facility], patient reports onset of right knee pain while at work, then became lightheaded and starting having chest pain. States he felt sweaty/hot and tried going outside to cool off with no relief. Was initially hypertensive in the 200's systolic per ems but had came down to the 150's by arrival here. Reports hx of HTN but being off meds for almost a year due to it making him sleepy. States chest pain has subsided some by arrival here. EKG performed upon arrival here and noted to have  a few PVC's, then went into trigeminy, EKG repeated and given to Rancour MD.   Allergies Allergies  Allergen Reactions   Shellfish Allergy Shortness Of Breath and Swelling    Level of Care/Admitting Diagnosis ED Disposition     ED Disposition  Admit   Condition  --   Comment  Hospital Area: [facility] [[id number]]  Level of Care: Telemetry [5]  Admit to tele based on following criteria: Monitor for Ischemic changes  May place patient in observation at [facility] or [facility] if equivalent level of care is available:: No  Covid Evaluation: Asymptomatic - no recent exposure (last 10 days) testing not required  Diagnosis: Chest pain [865784]  Admitting Physician: Bobette Mo [[id number]]  Attending Physician: Bobette Mo [[id number]]          B Medical/Surgery History Past Medical History:  Diagnosis Date   Hypertension    History reviewed. No pertinent surgical history.   A IV Location/Drains/Wounds Patient Lines/Drains/Airways Status     Active Line/Drains/Airways      Name Placement date Placement time Site Days   Peripheral IV 08/22/22 20 G Right Antecubital 08/22/22  0347  Antecubital  less than 1            Intake/Output Last 24 hours No intake or output data in the 24 hours ending 08/22/22 0856  Labs/Imaging Results for orders placed or performed during the hospital encounter of 08/22/22 (from the past 48 hour(s))  CBC with Differential     Status: Abnormal   Collection Time: 08/22/22  3:41 AM  Result Value Ref Range   WBC 8.2 4.0 - 10.5 K/uL   RBC 4.25 4.22 - 5.81 MIL/uL   Hemoglobin 15.6 13.0 - 17.0 g/dL   HCT 01.0 27.2 - 53.6 %   MCV 103.8 (H) 80.0 - 100.0 fL   MCH 36.7 (H) 26.0 - 34.0 pg   MCHC 35.4 30.0 - 36.0 g/dL   RDW 64.4 03.4 - 74.2 %   Platelets 215 150 - 400 K/uL   nRBC 0.0 0.0 - 0.2 %   Neutrophils Relative % 65 %   Neutro Abs 5.4 1.7 - 7.7 K/uL   Lymphocytes Relative 24 %   Lymphs Abs 1.9 0.7 - 4.0 K/uL   Monocytes Relative 8 %   Monocytes Absolute 0.6 0.1 - 1.0 K/uL   Eosinophils Relative 2 %   Eosinophils Absolute 0.2 0.0 - 0.5 K/uL   Basophils Relative 1 %   Basophils Absolute 0.0 0.0 - 0.1 K/uL   Immature Granulocytes 0 %  Abs Immature Granulocytes 0.02 0.00 - 0.07 K/uL    Comment: Performed at [facility], [address], [place], [place] [address]  Basic metabolic panel     Status: Abnormal   Collection Time: 08/22/22  3:41 AM  Result Value Ref Range   Sodium 134 (L) 135 - 145 mmol/L   Potassium 4.0 3.5 - 5.1 mmol/L    Comment: HEMOLYSIS AT THIS LEVEL MAY AFFECT RESULT   Chloride 101 98 - 111 mmol/L   CO2 23 22 - 32 mmol/L   Glucose, Bld 110 (H) 70 - 99 mg/dL    Comment: Glucose reference range applies only to samples taken after fasting for at least 8 hours.   BUN 12 6 - 20 mg/dL   Creatinine, Ser 6.21 0.61 - 1.24 mg/dL   Calcium 8.8 (L) 8.9 - 10.3 mg/dL   GFR, Estimated >30 >86 mL/min    Comment: (NOTE) Calculated using the CKD-EPI Creatinine Equation (2021)    Anion gap  10 5 - 15    Comment: Performed at [facility], [address], [place], [place] [address]  Troponin I (High Sensitivity)     Status: None   Collection Time: 08/22/22  3:41 AM  Result Value Ref Range   Troponin I (High Sensitivity) 3 <18 ng/L    Comment: (NOTE) Elevated high sensitivity troponin I (hsTnI) values and significant  changes across serial measurements may suggest ACS but many other  chronic and acute conditions are known to elevate hsTnI results.  Refer to the "Links" section for chest pain algorithms and additional  guidance. Performed at [facility], [address], [place], [place] [address]   Brain natriuretic peptide     Status: None   Collection Time: 08/22/22  3:41 AM  Result Value Ref Range   B Natriuretic Peptide 8.6 0.0 - 100.0 pg/mL    Comment: Performed at [facility], [address] [place], [place] [address]  D-dimer, quantitative     Status: None   Collection Time: 08/22/22  3:41 AM  Result Value Ref Range   D-Dimer, Quant 0.30 0.00 - 0.50 ug/mL-FEU    Comment: (NOTE) At the manufacturer cut-off value of 0.5 g/mL FEU, this assay has a negative predictive value of 95-100%.This assay is intended for use in conjunction with a clinical pretest probability (PTP) assessment model to exclude pulmonary embolism (PE) and deep venous thrombosis (DVT) in outpatients suspected of PE or DVT. Results should be correlated with clinical presentation. Performed at [facility], [address] [place], [place] [address]   Troponin I (High Sensitivity)     Status: None   Collection Time: 08/22/22  5:13 AM  Result Value Ref Range   Troponin I (High Sensitivity) 4 <18 ng/L    Comment: (NOTE) Elevated high sensitivity troponin I (hsTnI) values and significant  changes across serial measurements may suggest ACS but many other  chronic and acute conditions are known to elevate hsTnI results.   Refer to the "Links" section for chest pain algorithms and additional  guidance. Performed at [facility], [address], [place], [place] [address]    CT Angio Chest/Abd/Pel for Dissection W and/or Wo Contrast  Result Date: 08/22/2022 CLINICAL DATA:  42 year old male presenting with lightheadedness and chest pain. EXAM: CT ANGIOGRAPHY CHEST, ABDOMEN AND PELVIS TECHNIQUE: Non-contrast CT of the chest was initially obtained. Multidetector CT imaging through the chest, abdomen and pelvis was performed using the  standard protocol during bolus administration of intravenous contrast. Multiplanar reconstructed images and MIPs were obtained and reviewed to evaluate the vascular anatomy. RADIATION DOSE REDUCTION: This exam was performed according to the departmental dose-optimization program which includes automated exposure control, adjustment of the mA and/or kV according to patient size and/or use of iterative reconstruction technique. CONTRAST:  OMNIPAQUE IOHEXOL 350 MG/ML SOLN COMPARISON:  No prior chest CT. CT of the abdomen and pelvis 07/29/2017. FINDINGS: CTA CHEST FINDINGS Cardiovascular: Precontrast images demonstrate no crescentic high attenuation associated with the wall of the thoracic aorta to suggest the presence of acute intramural hemorrhage. Postcontrast images demonstrate no evidence of thoracic aortic aneurysm or dissection. Ascending thoracic aorta, mid arch and descending thoracic aorta measure 3.8 cm, 3.0 cm and 2.7 cm in diameter respectively. No atherosclerotic calcifications are noted in the thoracic aorta or the coronary arteries. Heart size is normal. There is no significant pericardial fluid, thickening or pericardial calcification. Mediastinum/Nodes: No pathologically enlarged mediastinal or hilar lymph nodes. Esophagus is unremarkable in appearance. No axillary lymphadenopathy. Lungs/Pleura: No acute consolidative airspace disease. No pleural effusions. No  pneumothorax. No suspicious appearing pulmonary nodules or masses are noted. Musculoskeletal: There are no aggressive appearing lytic or blastic lesions noted in the visualized portions of the skeleton. Review of the MIP images confirms the above findings. CTA ABDOMEN AND PELVIS FINDINGS VASCULAR Aorta: Normal caliber aorta without aneurysm, dissection, vasculitis or significant stenosis. Celiac: Patent without evidence of aneurysm, dissection, vasculitis or significant stenosis. SMA: Patent without evidence of aneurysm, dissection, vasculitis or significant stenosis. Renals: Single right and 3 left renal arteries are noted. All renal arteries are patent without evidence of aneurysm, dissection, vasculitis, fibromuscular dysplasia or significant stenosis. IMA: Patent without evidence of aneurysm, dissection, vasculitis or significant stenosis. Inflow: Patent without evidence of aneurysm, dissection, vasculitis or significant stenosis. Veins: No obvious venous abnormality within the limitations of this arterial phase study. Review of the MIP images confirms the above findings. NON-VASCULAR Hepatobiliary: No focal liver abnormality is seen. No gallstones, gallbladder wall thickening, or biliary dilatation. Pancreas: Unremarkable. No pancreatic ductal dilatation or surrounding inflammatory changes. Spleen: Normal in size without focal abnormality. Adrenals/Urinary Tract: Adrenal glands are unremarkable. Kidneys are normal, without renal calculi, focal lesion, or hydronephrosis. Bladder is unremarkable. Stomach/Bowel: Stomach is within normal limits. Appendix appears normal. No evidence of bowel wall thickening, distention, or inflammatory changes. Lymphatic: No lymphadenopathy noted in the abdomen or pelvis. Reproductive: Prostate gland and seminal vesicles are unremarkable in appearance. Other: No significant volume of ascites.  No pneumoperitoneum. Musculoskeletal: There are no aggressive appearing lytic or blastic  lesions noted in the visualized portions of the skeleton. Review of the MIP images confirms the above findings. IMPRESSION: 1. No acute findings are noted in the chest, abdomen or pelvis. Specifically, no evidence of acute aortic syndrome. Electronically Signed   By: Trudie Reed M.D.   On: 08/22/2022 06:14   DG Chest 2 View  Result Date: 08/22/2022 CLINICAL DATA:  Mid chest pain and right knee pain. No known injury. EXAM: CHEST - 2 VIEW; RIGHT KNEE - COMPLETE 4+ VIEW COMPARISON:  None Available. FINDINGS: PA and lateral chest: The heart size and mediastinal contours are within normal limits. Both lungs are clear. The visualized skeletal structures are unremarkable. Right knee, routine four views: There is a small suprapatellar bursal effusion. Normal bone mineralization. There is no evidence of fracture, dislocation or degenerative changes. Superficial soft tissues are unremarkable. IMPRESSION: 1. No evidence of acute chest disease. 2.  Small right knee suprapatellar bursal effusion. No evidence of fractures. Electronically Signed   By: Almira Bar M.D.   On: 08/22/2022 04:15   DG Knee Complete 4 Views Right  Result Date: 08/22/2022 CLINICAL DATA:  Mid chest pain and right knee pain. No known injury. EXAM: CHEST - 2 VIEW; RIGHT KNEE - COMPLETE 4+ VIEW COMPARISON:  None Available. FINDINGS: PA and lateral chest: The heart size and mediastinal contours are within normal limits. Both lungs are clear. The visualized skeletal structures are unremarkable. Right knee, routine four views: There is a small suprapatellar bursal effusion. Normal bone mineralization. There is no evidence of fracture, dislocation or degenerative changes. Superficial soft tissues are unremarkable. IMPRESSION: 1. No evidence of acute chest disease. 2. Small right knee suprapatellar bursal effusion. No evidence of fractures. Electronically Signed   By: Almira Bar M.D.   On: 08/22/2022 04:15    Pending Labs Unresulted Labs  (From admission, onward)     Start     Ordered   08/29/22 0500  Creatinine, serum  (enoxaparin (LOVENOX)    CrCl >/= 30 ml/min)  Weekly,   R     Comments: while on enoxaparin therapy    08/22/22 0809   08/23/22 0500  HIV Antibody (routine testing w rflx)  (HIV Antibody (Routine testing w reflex) panel)  Tomorrow morning,   R        08/22/22 0809   08/23/22 0500  Comprehensive metabolic panel  Tomorrow morning,   R        08/22/22 0809            Vitals/Pain Today's Vitals   08/22/22 0630 08/22/22 0700 08/22/22 0713 08/22/22 0853  BP: (!) 132/97 (!) 147/97  (!) 141/102  Pulse: 62 69 64 62  Resp: 19 15 14    Temp:   98.2 F (36.8 C)   TempSrc:   Oral   SpO2: 98% 99% 98% 99%  Weight:      Height:      PainSc:        Isolation Precautions No active isolations  Medications Medications  acetaminophen (TYLENOL) tablet 650 mg (has no administration in time range)  ondansetron (ZOFRAN) injection 4 mg (has no administration in time range)  enoxaparin (LOVENOX) injection 40 mg (has no administration in time range)  aspirin chewable tablet 324 mg (324 mg Oral Given 08/22/22 0337)  iohexol (OMNIPAQUE) 350 MG/ML injection 100 mL (100 mLs Intravenous Contrast Given 08/22/22 0526)    Mobility walks     Focused Assessments    R Recommendations: See Admitting Provider Note  Report given to:   Additional Notes:

## 2022-08-22 NOTE — Progress Notes (Signed)
Echocardiogram 2D Echocardiogram has been performed.  Toni Amend 08/22/2022, 10:40 AM

## 2022-08-22 NOTE — Discharge Instructions (Signed)
   Your cardiac CT will be scheduled at one of the below locations:   [facility] [address] [place], [place] [address] [phone]  OR  [facility] [address] [place], [place] [address] [phone]  OR   [facility] [address] [place], [place] [address] [phone]  If scheduled at [facility], please arrive at the [facility] and Children's Entrance (Entrance C2) of [facility] 30 minutes prior to test start time. You can use the FREE valet parking offered at entrance C (encouraged to control the heart rate for the test)  Proceed to the [facility] Radiology Department (first floor) to check-in and test prep.  All radiology patients and guests should use entrance C2 at [facility], accessed from [facility], even though the hospital's physical address listed is [address].    If scheduled at [facility] or [facility], please arrive 15 mins early for check-in and test prep.   Please follow these instructions carefully (unless otherwise directed):  Hold all erectile dysfunction medications at least 3 days (72 hrs) prior to test. (Ie viagra, cialis, sildenafil, tadalafil, etc) We will administer nitroglycerin during this exam.   On the Night Before the Test: Be sure to Drink plenty of water. Do not consume any caffeinated/decaffeinated beverages or chocolate 12 hours prior to your test. Do not take any antihistamines 12 hours prior to your test. If the patient has contrast allergy: Patient will need a prescription for Prednisone and very clear instructions (as follows): Prednisone 50 mg - take 13 hours prior to test Take another Prednisone 50 mg 7 hours prior to test Take another Prednisone 50 mg 1 hour prior to test Take Benadryl 50 mg 1 hour prior to test Patient must complete all  four doses of above prophylactic medications. Patient will need a ride after test due to Benadryl.  On the Day of the Test: Drink plenty of water until 1 hour prior to the test. Do not eat any food 1 hour prior to test. You may take your regular medications prior to the test.  Take metoprolol (Lopressor) two hours prior to test. If you take Furosemide/Hydrochlorothiazide/Spironolactone, please HOLD on the morning of the test.       After the Test: Drink plenty of water. After receiving IV contrast, you may experience a mild flushed feeling. This is normal. On occasion, you may experience a mild rash up to 24 hours after the test. This is not dangerous. If this occurs, you can take Benadryl 25 mg and increase your fluid intake. If you experience trouble breathing, this can be serious. If it is severe call 911 IMMEDIATELY. If it is mild, please call our office. If you take any of these medications: Glipizide/Metformin, Avandament, Glucavance, please do not take 48 hours after completing test unless otherwise instructed.  We will call to schedule your test 2-4 weeks out understanding that some insurance companies will need an authorization prior to the service being performed.   For non-scheduling related questions, please contact the cardiac imaging nurse navigator should you have any questions/concerns: Rockwell Alexandria, Cardiac Imaging Nurse Navigator Larey Brick, Cardiac Imaging Nurse Navigator [facility] Direct Office Dial: [phone]   For scheduling needs, including cancellations and rescheduling, please call [place], [phone].

## 2022-08-22 NOTE — Progress Notes (Signed)
  Transition of Care [facility]) Screening Note   Patient Details  Name: Justin Benton Date of Birth: 1981/01/08   Transition of Care [facility]) CM/SW Contact:    Lanier Clam, RN Phone Number: 08/22/2022, 2:17 PM    Transition of Care Department [facility]) has reviewed patient and no TOC needs have been identified at this time. We will continue to monitor patient advancement through interdisciplinary progression rounds. If new patient transition needs arise, please place a TOC consult.

## 2022-08-22 NOTE — ED Triage Notes (Signed)
Patient brought in by [facility], patient reports onset of right knee pain while at work, then became lightheaded and starting having chest pain. States he felt sweaty/hot and tried going outside to cool off with no relief. Was initially hypertensive in the 200's systolic per ems but had came down to the 150's by arrival here. Reports hx of HTN but being off meds for almost a year due to it making him sleepy. States chest pain has subsided some by arrival here. EKG performed upon arrival here and noted to have  a few PVC's, then went into trigeminy, EKG repeated and given to Rancour MD.

## 2022-08-22 NOTE — H&P (Signed)
History and Physical    Patient: Justin Benton:[id number] DOB: 08-Mar-1981 DOA: 08/22/2022 DOS: the patient was seen and examined on 08/22/2022 PCP: [facility]  Patient coming from: Home  Chief Complaint:  Chief Complaint  Patient presents with   Chest Pain   HPI: [name] is a 42 y.o. male with medical history significant of hypertension, hyperlipidemia and depression who was brought to the emergency department via EMS after he developed dizziness, chest pain and dyspnea at work.  The patient stated that he had sudden onset of right knee pain radiated to his calf while he was working.  He was feeling warm and sweaty so he went outside to try to cool off and felt no relief.  After a while, he then he developed pressure-like, precordial chest pain associated with diaphoresis, dizziness and dyspnea.   No  PND, orthopnea or pitting edema of the lower extremities.. He denied fever, chills, rhinorrhea, sore throat, wheezing or hemoptysis.   No abdominal pain, nausea, emesis, diarrhea, constipation, melena or hematochezia.  No flank pain, dysuria, frequency or hematuria.  No polyuria, polydipsia, polyphagia or blurred vision. He was taking a antihypertensive, a statin and an antidepressant, but subsequently stopped taking all his medications and has not taken them since at least a year  ED course: Initial vital signs were temperature 98.6 F, pulse 94, respiration 18, BP 154/103 mmHg and 100% on room air.  The patient received aspirin 324 mg p.o. x 1.  Lab work: CBC 0 white count of 8.2, hemoglobin 15.6 g/dL with an MCV 811.9 fL and platelets 215.  D-dimer was normal.  Troponin x 2 and BNP.  BMP with sodium 134, glucose 110 and calcium 8.8 mg deciliter.  The rest of the electrolytes and renal function were normal.  Imaging: 2 view chest radiograph no evidence of acute chest disease.  Right knee x-ray with small right knee suprapatellar bursal effusion.  But no evidence of fractures.   CTA chest:/Abdomen/pelvis with no acute findings.   Review of Systems: As mentioned in the history of present illness. All other systems reviewed and are negative. Past Medical History:  Diagnosis Date   Hypertension    History reviewed. No pertinent surgical history. Social History:  reports that he has been smoking. He does not have any smokeless tobacco history on file. He reports that he does not drink alcohol and does not use drugs.  Allergies  Allergen Reactions   Shellfish Allergy Shortness Of Breath and Swelling   Family history. Hypertension-mother  Prior to Admission medications   Not on File    Physical Exam: Vitals:   08/22/22 0500 08/22/22 0600 08/22/22 0630 08/22/22 0713  BP: (!) 138/91 (!) 136/99 (!) 132/97   Pulse: 66 69 62   Resp: 15 16 19    Temp:    98.2 F (36.8 C)  TempSrc:    Oral  SpO2: 98% 97% 98%   Weight:      Height:       Physical Exam Vitals and nursing note reviewed.  Constitutional:      Appearance: He is well-developed.  HENT:     Head: Normocephalic.     Nose: No rhinorrhea.     Mouth/Throat:     Mouth: Mucous membranes are moist.  Eyes:     Pupils: Pupils are equal, round, and reactive to light.  Neck:     Vascular: No JVD.  Cardiovascular:     Rate and Rhythm: Normal rate and regular  rhythm.  Pulmonary:     Breath sounds: No wheezing, rhonchi or rales.  Abdominal:     General: Bowel sounds are normal.     Palpations: Abdomen is soft.  Musculoskeletal:     Cervical back: Neck supple.     Right lower leg: No edema.     Left lower leg: No edema.  Skin:    General: Skin is warm and dry.  Neurological:     General: No focal deficit present.     Mental Status: He is alert and oriented to person, place, and time.  Psychiatric:        Mood and Affect: Mood normal.        Behavior: Behavior normal.    Data Reviewed:  There are no new results to review at this time.  Assessment and Plan: Principal Problem:   Chest  pain Observation/telemetry. Supplemental oxygen as needed. Continue daily aspirin. Normal troponin level. EKG as needed. Obtain echocardiogram.   Cardiology consult appreciated.  Active Problems:   Posttraumatic stress disorder   Anxiety with depression He stopped antidepressant. He then stopped all other meds due to depression. Will consult psychiatry.    Hypertensive emergency Begin amlodipine 5 mg p.o. daily. Monitor blood pressure and heart rate.    Hyperlipidemia Check fasting lipids. Will resume statin therapy after results.     Advance Care Planning:   Code Status: Full Code   Consults: Cardiology (Dr. Rachelle Hora Croitoru).  Family Communication:   Severity of Illness: The appropriate patient status for this patient is OBSERVATION. Observation status is judged to be reasonable and necessary in order to provide the required intensity of service to ensure the patient's safety. The patient's presenting symptoms, physical exam findings, and initial radiographic and laboratory data in the context of their medical condition is felt to place them at decreased risk for further clinical deterioration. Furthermore, it is anticipated that the patient will be medically stable for discharge from the hospital within 2 midnights of admission.   Author: Bobette Mo, MD 08/22/2022 7:48 AM  For on call review [email or web address].   This document was prepared using Dragon voice recognition software and may contain some unintended transcription errors.

## 2022-08-23 ENCOUNTER — Other Ambulatory Visit (HOSPITAL_COMMUNITY): Payer: Self-pay

## 2022-08-23 ENCOUNTER — Other Ambulatory Visit: Payer: Self-pay | Admitting: Student

## 2022-08-23 DIAGNOSIS — R0789 Other chest pain: Secondary | ICD-10-CM

## 2022-08-23 DIAGNOSIS — R072 Precordial pain: Secondary | ICD-10-CM

## 2022-08-23 LAB — LIPID PANEL
Cholesterol: 242 mg/dL — ABNORMAL HIGH (ref 0–200)
HDL: 42 mg/dL (ref >40)
LDL Cholesterol: 147 mg/dL — ABNORMAL HIGH (ref 0–99)
Total CHOL/HDL Ratio: 5.8 RATIO
Triglycerides: 265 mg/dL — ABNORMAL HIGH (ref <150)
VLDL: 53 mg/dL — ABNORMAL HIGH (ref 0–40)

## 2022-08-23 LAB — COMPREHENSIVE METABOLIC PANEL
ALT: 71 U/L — ABNORMAL HIGH (ref 0–44)
AST: 58 U/L — ABNORMAL HIGH (ref 15–41)
Albumin: 3.9 g/dL (ref 3.5–5.0)
Alkaline Phosphatase: 65 U/L (ref 38–126)
Anion gap: 8 (ref 5–15)
BUN: 11 mg/dL (ref 6–20)
CO2: 25 mmol/L (ref 22–32)
Calcium: 8.8 mg/dL — ABNORMAL LOW (ref 8.9–10.3)
Chloride: 100 mmol/L (ref 98–111)
Creatinine, Ser: 0.77 mg/dL (ref 0.61–1.24)
GFR, Estimated: >60 mL/min (ref >60)
Glucose, Bld: 113 mg/dL — ABNORMAL HIGH (ref 70–99)
Potassium: 3.7 mmol/L (ref 3.5–5.1)
Sodium: 133 mmol/L — ABNORMAL LOW (ref 135–145)
Total Bilirubin: 1.3 mg/dL — ABNORMAL HIGH (ref 0.3–1.2)
Total Protein: 7.4 g/dL (ref 6.5–8.1)

## 2022-08-23 LAB — HIV ANTIBODY (ROUTINE TESTING W REFLEX): HIV Screen 4th Generation wRfx: NONREACTIVE

## 2022-08-23 LAB — ETHANOL: Alcohol, Ethyl (B): <10 mg/dL (ref <10)

## 2022-08-23 MED ORDER — CARVEDILOL 3.125 MG PO TABS
3.1250 mg | ORAL_TABLET | Freq: Two times a day (BID) | ORAL | 1 refills | Status: DC
  Filled 2022-08-23: qty 60, 30d supply, fill #0

## 2022-08-23 MED ORDER — HYDROXYZINE HCL 25 MG PO TABS
25.0000 mg | ORAL_TABLET | Freq: Three times a day (TID) | ORAL | 0 refills | Status: AC | PRN
  Filled 2022-08-23: qty 30, 10d supply, fill #0

## 2022-08-23 MED ORDER — AMLODIPINE BESYLATE 5 MG PO TABS
5.0000 mg | ORAL_TABLET | Freq: Every day | ORAL | 1 refills | Status: AC
  Filled 2022-08-23: qty 30, 30d supply, fill #0

## 2022-08-23 MED ORDER — FLUOXETINE HCL 20 MG PO CAPS
20.0000 mg | ORAL_CAPSULE | Freq: Every day | ORAL | 1 refills | Status: AC
  Filled 2022-08-23: qty 30, 30d supply, fill #0

## 2022-08-23 MED ORDER — METOPROLOL TARTRATE 50 MG PO TABS: 50.0000 mg | ORAL_TABLET | Freq: Once | ORAL | 0 refills | Status: AC

## 2022-08-23 NOTE — Discharge Summary (Signed)
Physician Discharge Summary  Justin Benton ZOX:[id number] DOB: May 06, 1980 DOA: 08/22/2022  PCP: [facility]  Admit date: 08/22/2022 Discharge date: 08/23/2022  Time spent: 40 minutes  Recommendations for Outpatient Follow-up:  Follow outpatient CBC/CMP  Follow coronary CT scan Follow blood pressure outpatient, adjust regimen as able Follow A1c outpatient, consider statin outpatient Needs sleep study Encourage etoh cessation  Follow LFT's outpatient, additional workup as needed Needs psych follow up outpatient   Discharge Diagnoses:  Principal Problem:   Chest pain Active Problems:   Posttraumatic stress disorder   Hypertensive emergency   Anxiety with depression   Hyperlipidemia   Discharge Condition: stable  Diet recommendation: heart healthy  Filed Weights   08/22/22 0312  Weight: 83.9 kg    History of present illness:   Justin Benton is Justin Benton 42 y.o. male with medical history significant of hypertension, hyperlipidemia and depression who was brought to the emergency department via EMS after he developed dizziness, chest pain and dyspnea at work.  The patient stated that he had sudden onset of right knee pain radiated to his calf while he was working.  He was feeling warm and sweaty so he went outside to try to cool off and felt no relief.  After Justin Benton while, he then he developed pressure-like, precordial chest pain associated with diaphoresis, dizziness and dyspnea.   No  PND, orthopnea or pitting edema of the lower extremities.. He denied fever, chills, rhinorrhea, sore throat, wheezing or hemoptysis.   No abdominal pain, nausea, emesis, diarrhea, constipation, melena or hematochezia.  No flank pain, dysuria, frequency or hematuria.  No polyuria, polydipsia, polyphagia or blurred vision. He was taking Justin Benton antihypertensive, Justin Benton statin and an antidepressant, but subsequently stopped taking all his medications and has not taken them since at least Justin Benton year   He was admitted for  chest pain rule out.  Workup reassuring.  Cardiology arranging outpatient coronary CT.  See below for additional details    Hospital Course:  Assessment and Plan:  Chest pain Workup reassuring, possibly related to hypertension vs msk pain? Echo without WMA, CTA dissection protocol without acute findings Planning for outpatient coronary CT Appreciate cardiology assistance  Hypertension BP improved today, continue amlodipine at discharge  Posttraumatic stress disorder Anxiety with depression Will send out with prozac, hydroxyzine Denies SI Encouraged etoh cessation Encouraged outpatient psych follow up (no current indication for inpatient consult)   Hyperlipidemia The 10-year ASCVD risk score (Arnett DK, et al., 2019) is: 6.4%   Values used to calculate the score:     Age: 30 years     Sex: Male     Is Non-Hispanic African American: Yes     Diabetic: No     Tobacco smoker: Yes     Systolic Blood Pressure: 134 mmHg     Is BP treated: No     HDL Cholesterol: 42 mg/dL     Total Cholesterol: 242 mg/dL Follow Justin Benton outpatient, consider statin outpatient  Elevated LFT's Follow outpatient, maybe related to etoh use vs fatty liver  Knee pain Plain films with small right knee suprapatellar effusion   Needs sleep study     Procedures:  Echo IMPRESSIONS     1. Left ventricular ejection fraction, by estimation, is 55 to 60%. The  left ventricle has normal function. The left ventricle has no regional  wall motion abnormalities. Left ventricular diastolic parameters were  normal.   2. Right ventricular systolic function is normal. The right ventricular  size is normal.  Tricuspid regurgitation signal is inadequate for assessing  PA pressure.   3. The mitral valve is normal in structure. No evidence of mitral valve  regurgitation. No evidence of mitral stenosis.   4. The aortic valve is normal in structure. Aortic valve regurgitation is  not visualized. No aortic stenosis  is present.   5. The inferior vena cava is dilated in size with >50% respiratory  variability, suggesting right atrial pressure of 8 mmHg.    Consultations: cardiology  Discharge Exam: Vitals:   08/23/22 0602 08/23/22 0914  BP: (!) 145/100 (!) 134/94  Pulse: (!) 54 (!) 57  Resp: 13   Temp: 97.8 F (36.6 C)   SpO2: 100%    No new complaints Denies SI   General: No acute distress. Cardiovascular: Heart sounds show Justin Benton regular rate, and rhythm. Lungs: Clear to auscultation bilaterally  Abdomen: Soft, nontender, nondistended Neurological: Alert and oriented 3. Moves all extremities 4 with equal strength. Cranial nerves II through XII grossly intact. Skin: Warm and dry. No rashes or lesions. Extremities: No clubbing or cyanosis. No edema.  Discharge Instructions   Discharge Instructions     Call MD for:  difficulty breathing, headache or visual disturbances   Complete by: As directed    Call MD for:  extreme fatigue   Complete by: As directed    Call MD for:  hives   Complete by: As directed    Call MD for:  persistant dizziness or light-headedness   Complete by: As directed    Call MD for:  persistant nausea and vomiting   Complete by: As directed    Call MD for:  redness, tenderness, or signs of infection (pain, swelling, redness, odor or green/yellow discharge around incision site)   Complete by: As directed    Call MD for:  severe uncontrolled pain   Complete by: As directed    Call MD for:  temperature >100.4   Complete by: As directed    Diet - low sodium heart healthy   Complete by: As directed    Discharge instructions   Complete by: As directed    You were seen for chest pain.  This may have been related to your high blood pressure.  Your workup was reassuring so far.  Your ultrasound of the heart was reassuring.  Cardiology recommends you follow up for an outpatient coronary CT scan.  Cardiology is arranging follow up for this.  They sent [name] dose of  metoprolol for you to take prior to the procedure.    Your LDL was high.  You should follow up with your PCP regarding your A1c (diabetes test).  You can discuss whether [name] cholesterol medicine is indicated with your PCP.  I've stared you on amlodipine for your blood pressure.  We added coreg to help with your palpitations and blood pressure.  Follow your blood pressure with your PCP outpatient.   It sounds like you might have sleep apnea.  You should get an outpatient sleep study.  Treatment of sleep apnea can help with high blood pressure.  Your liver function tests are mildly elevated.  You should follow this up with your PCP outpatient.    Stopping drinking is important given your abnormal liver labs and your mood.  I think it would help with your palpitations.  Please follow up with your PCP if you need additional resources for this.  We'll start you back on prozac for your mood.  I'll add hydroxyzine for anxiety as needed.  Please follow up with your PCP regarding following up with Yesena Reaves psychiatrist outpatient.  Return for new, recurrent, or worsening symptoms.  Please ask your PCP to request records from this hospitalization so they know what was done and what the next steps will be.   Increase activity slowly   Complete by: As directed       Allergies as of 08/23/2022       Reactions   Shellfish Allergy Shortness Of Breath, Swelling        Medication List     TAKE these medications    amLODipine 5 MG tablet Commonly known as: NORVASC Take 1 tablet (5 mg total) by mouth daily. Start taking on: Aug 24, 2022   FLUoxetine 20 MG capsule Commonly known as: PROZAC Take 1 capsule (20 mg total) by mouth daily. Follow up with your PCP regarding titration of this medicine   hydrOXYzine 25 MG tablet Commonly known as: ATARAX Take 1 tablet (25 mg total) by mouth 3 (three) times daily as needed for anxiety. Don't drive if you take this.   metoprolol tartrate 50 MG  tablet Commonly known as: LOPRESSOR Take 1 tablet (50 mg total) by mouth once for 1 dose. Take 90 to 120 min prior to coronary CTA       Allergies  Allergen Reactions   Shellfish Allergy Shortness Of Breath and Swelling      The results of significant diagnostics from this hospitalization (including imaging, microbiology, ancillary and laboratory) are listed below for reference.    Significant Diagnostic Studies: ECHOCARDIOGRAM COMPLETE  Result Date: 08/22/2022    ECHOCARDIOGRAM REPORT   Patient Name:   VICKTOR ACOCELLA Date of Exam: 08/22/2022 Medical Rec #:  [id number]      Height:       69.0 in Accession #:    [id number]     Weight:       185.0 lb Date of Birth:  15-Apr-1980       BSA:          1.999 m Patient Age:    42 years       BP:           164/107 mmHg Patient Gender: M              HR:           57 bpm. Exam Location:  Inpatient Procedure: 2D Echo, Cardiac Doppler and Color Doppler Indications:    R07.9* Chest pain, unspecified  History:        Patient has no prior history of Echocardiogram examinations.                 Risk Factors:Hypertension.  Sonographer:    Mike Gip Referring Phys: [id number] DAVID MANUEL ORTIZ IMPRESSIONS  1. Left ventricular ejection fraction, by estimation, is 55 to 60%. The left ventricle has normal function. The left ventricle has no regional wall motion abnormalities. Left ventricular diastolic parameters were normal.  2. Right ventricular systolic function is normal. The right ventricular size is normal. Tricuspid regurgitation signal is inadequate for assessing PA pressure.  3. The mitral valve is normal in structure. No evidence of mitral valve regurgitation. No evidence of mitral stenosis.  4. The aortic valve is normal in structure. Aortic valve regurgitation is not visualized. No aortic stenosis is present.  5. The inferior vena cava is dilated in size with >50% respiratory variability, suggesting right atrial pressure of 8 mmHg. FINDINGS  Left Ventricle:  Left ventricular ejection  fraction, by estimation, is 55 to 60%. The left ventricle has normal function. The left ventricle has no regional wall motion abnormalities. The left ventricular internal cavity size was normal in size. There is  no left ventricular hypertrophy. Left ventricular diastolic parameters were normal. Normal left ventricular filling pressure. Right Ventricle: The right ventricular size is normal. No increase in right ventricular wall thickness. Right ventricular systolic function is normal. Tricuspid regurgitation signal is inadequate for assessing PA pressure. Left Atrium: Left atrial size was normal in size. Right Atrium: Right atrial size was normal in size. Pericardium: There is no evidence of pericardial effusion. Mitral Valve: The mitral valve is normal in structure. No evidence of mitral valve regurgitation. No evidence of mitral valve stenosis. Tricuspid Valve: The tricuspid valve is normal in structure. Tricuspid valve regurgitation is trivial. No evidence of tricuspid stenosis. Aortic Valve: The aortic valve is normal in structure. Aortic valve regurgitation is not visualized. No aortic stenosis is present. Pulmonic Valve: The pulmonic valve was normal in structure. Pulmonic valve regurgitation is trivial. No evidence of pulmonic stenosis. Aorta: The aortic root is normal in size and structure. Venous: The inferior vena cava is dilated in size with greater than 50% respiratory variability, suggesting right atrial pressure of 8 mmHg. IAS/Shunts: No atrial level shunt detected by color flow Doppler.  LEFT VENTRICLE PLAX 2D LVIDd:         5.20 cm      Diastology LVIDs:         3.50 cm      LV e' medial:    10.80 cm/s LV PW:         0.90 cm      LV E/e' medial:  7.0 LV IVS:        1.00 cm      LV e' lateral:   17.20 cm/s LVOT diam:     2.10 cm      LV E/e' lateral: 4.4 LV SV:         74 LV SV Index:   37 LVOT Area:     3.46 cm  LV Volumes (MOD) LV vol d, MOD A2C: 121.0 ml LV vol d, MOD  A4C: 124.0 ml LV vol s, MOD A2C: 52.0 ml LV vol s, MOD A4C: 49.7 ml LV SV MOD A2C:     69.0 ml LV SV MOD A4C:     124.0 ml LV SV MOD BP:      71.6 ml RIGHT VENTRICLE             IVC RV Basal diam:  3.80 cm     IVC diam: 2.40 cm RV S prime:     12.20 cm/s TAPSE (M-mode): 1.7 cm LEFT ATRIUM             Index        RIGHT ATRIUM           Index LA diam:        4.00 cm 2.00 cm/m   RA Area:     12.70 cm LA Vol (A2C):   61.5 ml 30.79 ml/m  RA Volume:   23.90 ml  11.96 ml/m LA Vol (A4C):   43.2 ml 21.61 ml/m LA Biplane Vol: 49.9 ml 24.97 ml/m  AORTIC VALVE             PULMONIC VALVE LVOT Vmax:   103.00 cm/s PR End Diast Vel: 4.00 msec LVOT Vmean:  74.100 cm/s LVOT VTI:    0.213 m  AORTA Ao Root diam: 3.20 cm Ao Asc diam:  3.20 cm MITRAL VALVE MV Area (PHT): 2.91 cm    SHUNTS MV Decel Time: 261 msec    Systemic VTI:  0.21 m MV E velocity: 75.40 cm/s  Systemic Diam: 2.10 cm MV Jazalynn Mireles velocity: 51.00 cm/s MV E/Denisse Whitenack ratio:  1.48 Armanda Magic MD Electronically signed by Armanda Magic MD Signature Date/Time: 08/22/2022/10:53:33 AM    Final    CT Angio Chest/Abd/Pel for Dissection W and/or Wo Contrast  Result Date: 08/22/2022 CLINICAL DATA:  42 year old male presenting with lightheadedness and chest pain. EXAM: CT ANGIOGRAPHY CHEST, ABDOMEN AND PELVIS TECHNIQUE: Non-contrast CT of the chest was initially obtained. Multidetector CT imaging through the chest, abdomen and pelvis was performed using the standard protocol during bolus administration of intravenous contrast. Multiplanar reconstructed images and MIPs were obtained and reviewed to evaluate the vascular anatomy. RADIATION DOSE REDUCTION: This exam was performed according to the departmental dose-optimization program which includes automated exposure control, adjustment of the mA and/or kV according to patient size and/or use of iterative reconstruction technique. CONTRAST:  OMNIPAQUE IOHEXOL 350 MG/ML SOLN COMPARISON:  No prior chest CT. CT of the abdomen and  pelvis 07/29/2017. FINDINGS: CTA CHEST FINDINGS Cardiovascular: Precontrast images demonstrate no crescentic high attenuation associated with the wall of the thoracic aorta to suggest the presence of acute intramural hemorrhage. Postcontrast images demonstrate no evidence of thoracic aortic aneurysm or dissection. Ascending thoracic aorta, mid arch and descending thoracic aorta measure 3.8 cm, 3.0 cm and 2.7 cm in diameter respectively. No atherosclerotic calcifications are noted in the thoracic aorta or the coronary arteries. Heart size is normal. There is no significant pericardial fluid, thickening or pericardial calcification. Mediastinum/Nodes: No pathologically enlarged mediastinal or hilar lymph nodes. Esophagus is unremarkable in appearance. No axillary lymphadenopathy. Lungs/Pleura: No acute consolidative airspace disease. No pleural effusions. No pneumothorax. No suspicious appearing pulmonary nodules or masses are noted. Musculoskeletal: There are no aggressive appearing lytic or blastic lesions noted in the visualized portions of the skeleton. Review of the MIP images confirms the above findings. CTA ABDOMEN AND PELVIS FINDINGS VASCULAR Aorta: Normal caliber aorta without aneurysm, dissection, vasculitis or significant stenosis. Celiac: Patent without evidence of aneurysm, dissection, vasculitis or significant stenosis. SMA: Patent without evidence of aneurysm, dissection, vasculitis or significant stenosis. Renals: Single right and 3 left renal arteries are noted. All renal arteries are patent without evidence of aneurysm, dissection, vasculitis, fibromuscular dysplasia or significant stenosis. IMA: Patent without evidence of aneurysm, dissection, vasculitis or significant stenosis. Inflow: Patent without evidence of aneurysm, dissection, vasculitis or significant stenosis. Veins: No obvious venous abnormality within the limitations of this arterial phase study. Review of the MIP images confirms the  above findings. NON-VASCULAR Hepatobiliary: No focal liver abnormality is seen. No gallstones, gallbladder wall thickening, or biliary dilatation. Pancreas: Unremarkable. No pancreatic ductal dilatation or surrounding inflammatory changes. Spleen: Normal in size without focal abnormality. Adrenals/Urinary Tract: Adrenal glands are unremarkable. Kidneys are normal, without renal calculi, focal lesion, or hydronephrosis. Bladder is unremarkable. Stomach/Bowel: Stomach is within normal limits. Appendix appears normal. No evidence of bowel wall thickening, distention, or inflammatory changes. Lymphatic: No lymphadenopathy noted in the abdomen or pelvis. Reproductive: Prostate gland and seminal vesicles are unremarkable in appearance. Other: No significant volume of ascites.  No pneumoperitoneum. Musculoskeletal: There are no aggressive appearing lytic or blastic lesions noted in the visualized portions of the skeleton. Review of the MIP images confirms the above findings. IMPRESSION: 1. No acute findings are noted  in the chest, abdomen or pelvis. Specifically, no evidence of acute aortic syndrome. Electronically Signed   By: Trudie Reed M.D.   On: 08/22/2022 06:14   DG Chest 2 View  Result Date: 08/22/2022 CLINICAL DATA:  Mid chest pain and right knee pain. No known injury. EXAM: CHEST - 2 VIEW; RIGHT KNEE - COMPLETE 4+ VIEW COMPARISON:  None Available. FINDINGS: PA and lateral chest: The heart size and mediastinal contours are within normal limits. Both lungs are clear. The visualized skeletal structures are unremarkable. Right knee, routine four views: There is Bristyl Mclees small suprapatellar bursal effusion. Normal bone mineralization. There is no evidence of fracture, dislocation or degenerative changes. Superficial soft tissues are unremarkable. IMPRESSION: 1. No evidence of acute chest disease. 2. Small right knee suprapatellar bursal effusion. No evidence of fractures. Electronically Signed   By: Almira Bar  M.D.   On: 08/22/2022 04:15   DG Knee Complete 4 Views Right  Result Date: 08/22/2022 CLINICAL DATA:  Mid chest pain and right knee pain. No known injury. EXAM: CHEST - 2 VIEW; RIGHT KNEE - COMPLETE 4+ VIEW COMPARISON:  None Available. FINDINGS: PA and lateral chest: The heart size and mediastinal contours are within normal limits. Both lungs are clear. The visualized skeletal structures are unremarkable. Right knee, routine four views: There is Juanisha Bautch small suprapatellar bursal effusion. Normal bone mineralization. There is no evidence of fracture, dislocation or degenerative changes. Superficial soft tissues are unremarkable. IMPRESSION: 1. No evidence of acute chest disease. 2. Small right knee suprapatellar bursal effusion. No evidence of fractures. Electronically Signed   By: Almira Bar M.D.   On: 08/22/2022 04:15    Microbiology: No results found for this or any previous visit (from the past 240 hour(s)).   Labs: Basic Metabolic Panel: Recent Labs  Lab 08/22/22 0341 08/23/22 0423  NA 134* 133*  K 4.0 3.7  CL 101 100  CO2 23 25  GLUCOSE 110* 113*  BUN 12 11  CREATININE 0.94 0.77  CALCIUM 8.8* 8.8*   Liver Function Tests: Recent Labs  Lab 08/23/22 0423  AST 58*  ALT 71*  ALKPHOS 65  BILITOT 1.3*  PROT 7.4  ALBUMIN 3.9   No results for input(s): "LIPASE", "AMYLASE" in the last 168 hours. No results for input(s): "AMMONIA" in the last 168 hours. CBC: Recent Labs  Lab 08/22/22 0341  WBC 8.2  NEUTROABS 5.4  HGB 15.6  HCT 44.1  MCV 103.8*  PLT 215   Cardiac Enzymes: No results for input(s): "CKTOTAL", "CKMB", "CKMBINDEX", "TROPONINI" in the last 168 hours. BNP: BNP (last 3 results) Recent Labs    08/22/22 0341  BNP 8.6    ProBNP (last 3 results) No results for input(s): "PROBNP" in the last 8760 hours.  CBG: No results for input(s): "GLUCAP" in the last 168 hours.     Signed:  Lacretia Nicks MD.  [facility] 08/23/2022, 10:55 AM

## 2022-08-23 NOTE — Progress Notes (Signed)
Ordered outpatient coronary CTA for further evaluation of chest pain. Please see consult note from 08/22/2022 for more information.  Corrin Parker, PA-C 08/23/2022 10:57 AM

## 2022-09-28 NOTE — Progress Notes (Deleted)
Cardiology Office Note:    Date:  09/28/2022   ID:  Justin Benton, DOB January 07, 1981, MRN [id number]  PCP:  [facility]  Cardiologist:  Thurmon Fair, MD  Electrophysiologist:  None   Referring MD: [facility]   Chief Complaint: hospital follow-up of chest pain  History of Present Illness:    Justin Benton is a 42 y.o. male with a history of hypertension, hyperlipidemia, anxiety/ depression, and tobacco abuse who is followed by Dr. Royann Shivers and presents today for hospital follow-up of chest pain.   Patient was first seen by Cardiology during recent hospitalization. She was admitted from 08/22/2022 to 08/23/2022 for chest pain. He presented after sudden onset of chest pain while at work in [employer] where he was doing his usual activities which include a lot of bending and lifting. EKG showed T wave inversions in inferior leads. High-sensitivity troponin, BNP, and D-dimer were all negative. CTA was negative for aortic dissection. Echo showed LVEF of 55-60% with no regional wall motion abnormalities and no significant valvular disease. He was seen by Cardiology. Overall symptoms seemed atypical but outpatient coronary CTA was recommended given multiple CV risk factors (HTN, HLD, and long smoking history). He also described severe depression during hospitalization and was started on Prozac and Hydroxyzine.   Patient presents today for follow-up. ***  Chest Pain Patient was recently admitted in 08/2022 for atypical chest pain. He ruled out for MI. Echo showed LVEF of 55-60% with no regional wall motion abnormalities and no significant valvular disease. Outpatient coronary CTA was recommended ***  Hypertension BP *** - Continue Amlodipine 5mg  daily.  - Coreg ***  Hyperlipidemia Lipid panel in 08/2022 during recent admission: Total Cholesterol 242, Triglycerides 265, HDL 42, LDL 147.  - 10-year ASCVD risk is 15.2%. Considered intermediate risk; therefore, moderate  intensity statin recommended. Will start Lipitor 20mg  daily and then repeat lipid panel and LFTs in 6-8 weeks. ***  Tobacco Abuse  EKGs/Labs/Other Studies Reviewed:    The following studies were reviewed:  Echocardiogram 08/22/2022: Impressions: 1. Left ventricular ejection fraction, by estimation, is 55 to 60%. The  left ventricle has normal function. The left ventricle has no regional  wall motion abnormalities. Left ventricular diastolic parameters were  normal.   2. Right ventricular systolic function is normal. The right ventricular  size is normal. Tricuspid regurgitation signal is inadequate for assessing  PA pressure.   3. The mitral valve is normal in structure. No evidence of mitral valve  regurgitation. No evidence of mitral stenosis.   4. The aortic valve is normal in structure. Aortic valve regurgitation is  not visualized. No aortic stenosis is present.   5. The inferior vena cava is dilated in size with >50% respiratory  variability, suggesting right atrial pressure of 8 mmHg.    EKG:  EKG not ordered today.   Recent Labs: 08/22/2022: B Natriuretic Peptide 8.6; Hemoglobin 15.6; Platelets 215 08/23/2022: ALT 71; BUN 11; Creatinine, Ser 0.77; Potassium 3.7; Sodium 133  Recent Lipid Panel    Component Value Date/Time   CHOL 242 (H) 08/23/2022 0423   TRIG 265 (H) 08/23/2022 0423   HDL 42 08/23/2022 0423   CHOLHDL 5.8 08/23/2022 0423   VLDL 53 (H) 08/23/2022 0423   LDLCALC 147 (H) 08/23/2022 0423    Physical Exam:    Vital Signs: There were no vitals taken for this visit.    Wt Readings from Last 3 Encounters:  08/22/22 185 lb (83.9 kg)  06/27/14 175  lb (79.4 kg)     General: 42 y.o. male in no acute distress. HEENT: Normocephalic and atraumatic. Sclera clear. EOMs intact. Neck: Supple. No carotid bruits. No JVD. Heart: *** RRR. Distinct S1 and S2. No murmurs, gallops, or rubs. Radial and distal pedal pulses 2+ and equal bilaterally. Lungs: No increased  work of breathing. Clear to ausculation bilaterally. No wheezes, rhonchi, or rales.  Abdomen: Soft, non-distended, and non-tender to palpation. Bowel sounds present in all 4 quadrants.  MSK: Normal strength and tone for age. *** Extremities: No lower extremity edema.    Skin: Warm and dry. Neuro: Alert and oriented x3. No focal deficits. Psych: Normal affect. Responds appropriately.   Assessment:    No diagnosis found.  Plan:     Disposition: Follow up in ***   Medication Adjustments/Labs and Tests Ordered: Current medicines are reviewed at length with the patient today.  Concerns regarding medicines are outlined above.  No orders of the defined types were placed in this encounter.  No orders of the defined types were placed in this encounter.   There are no Patient Instructions on file for this visit.   Signed, Corrin Parker, PA-C  09/28/2022 5:28 PM    [facility]

## 2022-10-08 ENCOUNTER — Ambulatory Visit: Payer: BC Managed Care – PPO | Attending: Student | Admitting: Student

## 2022-10-24 ENCOUNTER — Encounter (HOSPITAL_COMMUNITY): Payer: Self-pay
